# Patient Record
Sex: Female | Born: 1960
Health system: Southern US, Community
[De-identification: ages and names within clinical notes are randomized; demographics above are authoritative.]

## PROBLEM LIST (undated history)

## (undated) DIAGNOSIS — M1990 Unspecified osteoarthritis, unspecified site: Secondary | ICD-10-CM

## (undated) DIAGNOSIS — K219 Gastro-esophageal reflux disease without esophagitis: Secondary | ICD-10-CM

## (undated) DIAGNOSIS — K8020 Calculus of gallbladder without cholecystitis without obstruction: Secondary | ICD-10-CM

## (undated) DIAGNOSIS — K8050 Calculus of bile duct without cholangitis or cholecystitis without obstruction: Secondary | ICD-10-CM

## (undated) DIAGNOSIS — Z8742 Personal history of other diseases of the female genital tract: Secondary | ICD-10-CM

## (undated) DIAGNOSIS — E049 Nontoxic goiter, unspecified: Secondary | ICD-10-CM

## (undated) DIAGNOSIS — Z8759 Personal history of other complications of pregnancy, childbirth and the puerperium: Secondary | ICD-10-CM

## (undated) DIAGNOSIS — Z8489 Family history of other specified conditions: Secondary | ICD-10-CM

## (undated) DIAGNOSIS — Z973 Presence of spectacles and contact lenses: Secondary | ICD-10-CM

## (undated) DIAGNOSIS — K5900 Constipation, unspecified: Secondary | ICD-10-CM

## (undated) DIAGNOSIS — R600 Localized edema: Secondary | ICD-10-CM

## (undated) HISTORY — PX: ECTOPIC PREGNANCY SURGERY: SHX613

## (undated) HISTORY — PX: COLONOSCOPY: SHX174

## (undated) HISTORY — PX: RIGHT OOPHORECTOMY: SHX2359

---

## 1982-04-14 DIAGNOSIS — Z8759 Personal history of other complications of pregnancy, childbirth and the puerperium: Secondary | ICD-10-CM

## 1982-04-14 HISTORY — DX: Personal history of other complications of pregnancy, childbirth and the puerperium: Z87.59

## 2003-10-19 ENCOUNTER — Other Ambulatory Visit: Admission: RE | Admit: 2003-10-19 | Discharge: 2003-10-19 | Payer: Self-pay | Admitting: Obstetrics and Gynecology

## 2005-01-08 ENCOUNTER — Other Ambulatory Visit: Admission: RE | Admit: 2005-01-08 | Discharge: 2005-01-08 | Payer: Self-pay | Admitting: Obstetrics and Gynecology

## 2008-04-14 HISTORY — PX: KNEE ARTHROSCOPY: SUR90

## 2013-07-28 ENCOUNTER — Other Ambulatory Visit: Payer: Self-pay | Admitting: Physician Assistant

## 2013-07-28 MED ORDER — OSELTAMIVIR PHOSPHATE 75 MG PO CAPS
75.0000 mg | ORAL_CAPSULE | Freq: Every day | ORAL | Status: AC
Start: 2013-07-28 — End: 2013-08-02

## 2014-08-28 ENCOUNTER — Ambulatory Visit (INDEPENDENT_AMBULATORY_CARE_PROVIDER_SITE_OTHER): Payer: BLUE CROSS/BLUE SHIELD | Admitting: Internal Medicine

## 2014-08-28 ENCOUNTER — Encounter: Payer: Self-pay | Admitting: Internal Medicine

## 2014-08-28 VITALS — BP 138/86 | HR 74 | Temp 98.0°F | Resp 18 | Ht 65.0 in | Wt 243.0 lb

## 2014-08-28 DIAGNOSIS — Z131 Encounter for screening for diabetes mellitus: Secondary | ICD-10-CM

## 2014-08-28 DIAGNOSIS — Z1329 Encounter for screening for other suspected endocrine disorder: Secondary | ICD-10-CM

## 2014-08-28 DIAGNOSIS — E559 Vitamin D deficiency, unspecified: Secondary | ICD-10-CM

## 2014-08-28 DIAGNOSIS — Z79899 Other long term (current) drug therapy: Secondary | ICD-10-CM

## 2014-08-28 DIAGNOSIS — Z1389 Encounter for screening for other disorder: Secondary | ICD-10-CM

## 2014-08-28 DIAGNOSIS — IMO0001 Reserved for inherently not codable concepts without codable children: Secondary | ICD-10-CM

## 2014-08-28 DIAGNOSIS — R030 Elevated blood-pressure reading, without diagnosis of hypertension: Secondary | ICD-10-CM

## 2014-08-28 DIAGNOSIS — Z0000 Encounter for general adult medical examination without abnormal findings: Secondary | ICD-10-CM

## 2014-08-28 DIAGNOSIS — Z130 Encounter for screening for diseases of the blood and blood-forming organs and certain disorders involving the immune mechanism: Secondary | ICD-10-CM

## 2014-08-28 DIAGNOSIS — Z13220 Encounter for screening for lipoid disorders: Secondary | ICD-10-CM

## 2014-08-28 DIAGNOSIS — Z1212 Encounter for screening for malignant neoplasm of rectum: Secondary | ICD-10-CM

## 2014-08-28 LAB — HEPATIC FUNCTION PANEL
ALT: 15 U/L (ref 0–35)
AST: 14 U/L (ref 0–37)
Albumin: 4.1 g/dL (ref 3.5–5.2)
Alkaline Phosphatase: 78 U/L (ref 39–117)
BILIRUBIN INDIRECT: 0.2 mg/dL (ref 0.2–1.2)
Bilirubin, Direct: 0.1 mg/dL (ref 0.0–0.3)
Total Bilirubin: 0.3 mg/dL (ref 0.2–1.2)
Total Protein: 7.7 g/dL (ref 6.0–8.3)

## 2014-08-28 LAB — CBC WITH DIFFERENTIAL/PLATELET
BASOS PCT: 0 % (ref 0–1)
Basophils Absolute: 0 10*3/uL (ref 0.0–0.1)
EOS ABS: 0.2 10*3/uL (ref 0.0–0.7)
EOS PCT: 3 % (ref 0–5)
HEMATOCRIT: 36.8 % (ref 36.0–46.0)
HEMOGLOBIN: 12.1 g/dL (ref 12.0–15.0)
Lymphocytes Relative: 40 % (ref 12–46)
Lymphs Abs: 2.6 10*3/uL (ref 0.7–4.0)
MCH: 26.7 pg (ref 26.0–34.0)
MCHC: 32.9 g/dL (ref 30.0–36.0)
MCV: 81.2 fL (ref 78.0–100.0)
MONO ABS: 0.6 10*3/uL (ref 0.1–1.0)
MONOS PCT: 9 % (ref 3–12)
MPV: 10.8 fL (ref 8.6–12.4)
Neutro Abs: 3.2 10*3/uL (ref 1.7–7.7)
Neutrophils Relative %: 48 % (ref 43–77)
Platelets: 294 10*3/uL (ref 150–400)
RBC: 4.53 MIL/uL (ref 3.87–5.11)
RDW: 13.9 % (ref 11.5–15.5)
WBC: 6.6 10*3/uL (ref 4.0–10.5)

## 2014-08-28 LAB — BASIC METABOLIC PANEL WITH GFR
BUN: 10 mg/dL (ref 6–23)
CALCIUM: 9.6 mg/dL (ref 8.4–10.5)
CO2: 28 meq/L (ref 19–32)
CREATININE: 0.74 mg/dL (ref 0.50–1.10)
Chloride: 103 mEq/L (ref 96–112)
GFR, Est African American: >89 mL/min
GFR, Est Non African American: >89 mL/min
Glucose, Bld: 85 mg/dL (ref 70–99)
Potassium: 4.1 mEq/L (ref 3.5–5.3)
SODIUM: 140 meq/L (ref 135–145)

## 2014-08-28 LAB — LIPID PANEL
CHOLESTEROL: 180 mg/dL (ref 0–200)
HDL: 63 mg/dL (ref >=46)
LDL Cholesterol: 107 mg/dL — ABNORMAL HIGH (ref 0–99)
Total CHOL/HDL Ratio: 2.9 Ratio
Triglycerides: 52 mg/dL (ref <150)
VLDL: 10 mg/dL (ref 0–40)

## 2014-08-28 LAB — IRON AND TIBC
%SAT: 15 % — AB (ref 20–55)
IRON: 52 ug/dL (ref 42–145)
TIBC: 356 ug/dL (ref 250–470)
UIBC: 304 ug/dL (ref 125–400)

## 2014-08-28 LAB — MAGNESIUM: MAGNESIUM: 1.8 mg/dL (ref 1.5–2.5)

## 2014-08-28 LAB — VITAMIN B12: VITAMIN B 12: 804 pg/mL (ref 211–911)

## 2014-08-28 LAB — TSH: TSH: 1.117 u[IU]/mL (ref 0.350–4.500)

## 2014-08-28 MED ORDER — HYDROCHLOROTHIAZIDE 12.5 MG PO CAPS: 12.5000 mg | ORAL_CAPSULE | Freq: Every day | ORAL | Status: DC

## 2014-08-28 NOTE — Patient Instructions (Signed)

## 2014-08-28 NOTE — Progress Notes (Signed)
Patient ID: Vicki Shea, female   DOB: 19-Oct-1960, 54 y.o.   MRN: [id number]    Annual Screening Comprehensive Examination   This very nice 54 y.o.female presents for complete physical.  Patient has no major health issues.  Patient reports no complaints at this time.  Patient reports that she is not taking any medications and she is otherwise healthy.  She normally sees her ObGyn who recommends that she come here to be evaluated.  Patient reports that she was sitting a lot this weekend and then also got very excited during church but woke up this morning to some bruising.  She cannot think of a particular injury to the backs of her legs.    She does report that she has had some swelling in her legs.  She used to be on HCTZ.    She is not doing any type of exercise.  She is doing a lot of hours at work and she is going to school.     Finally, patient has history of Vitamin D Deficiency and last vitamin D was No results found for: VD25OH.  Currently on supplementation     No current outpatient prescriptions on file prior to visit.   No current facility-administered medications on file prior to visit.    Allergies  Allergen Reactions  . Cabbage     Red cabbage- Lip swelling  . Voltaren [Diclofenac Sodium]     Swelling    No past medical history on file.   There is no immunization history on file for this patient.  No past surgical history on file.  No family history on file.  History   Social History  . Marital Status: Unknown    Spouse Name: N/A  . Number of Children: N/A  . Years of Education: N/A   Occupational History  . Not on file.   Social History Main Topics  . Smoking status: Former Smoker    Quit date: 08/27/1984  . Smokeless tobacco: Not on file  . Alcohol Use: Not on file  . Drug Use: Not on file  . Sexual Activity: Not on file   Other Topics Concern  . Not on file   Social History Narrative  . No narrative on file   Review of Systems   Constitutional: Negative for fever, chills, weight loss and malaise/fatigue.  HENT: Negative for congestion, ear pain and sore throat.   Eyes: Negative.   Respiratory: Negative for cough, shortness of breath and wheezing.   Cardiovascular: Positive for leg swelling. Negative for chest pain and palpitations.  Gastrointestinal: Positive for heartburn and constipation. Negative for nausea, vomiting, diarrhea, blood in stool and melena.  Genitourinary: Negative.   Musculoskeletal: Positive for joint pain.  Skin: Negative.   Neurological: Positive for headaches. Negative for dizziness, sensory change and loss of consciousness.  Psychiatric/Behavioral: Negative for depression. The patient is not nervous/anxious and does not have insomnia.       Physical Exam  BP 138/86 mmHg  Pulse 74  Temp(Src) 98 F (36.7 C) (Temporal)  Resp 18  Ht 5\' 5"  (1.651 m)  Wt 243 lb (110.224 kg)  BMI 40.44 kg/m2  General Appearance: Well nourished and in no apparent distress. Eyes: PERRLA, EOMs, conjunctiva no swelling or erythema, normal fundi and vessels. Sinuses: No frontal/maxillary tenderness ENT/Mouth: EACs patent / TMs  nl. Nares clear without erythema, swelling, mucoid exudates. Oral hygiene is good. No erythema, swelling, or exudate. Tongue normal, non-obstructing. Tonsils not swollen or erythematous. Hearing normal.  Neck: Supple, thyroid normal. No bruits, nodes or JVD. Respiratory: Respiratory effort normal.  BS equal and clear bilateral without rales, rhonci, wheezing or stridor. Cardio: Heart sounds are normal with regular rate and rhythm and no murmurs, rubs or gallops. Peripheral pulses are normal and equal bilaterally without edema. No aortic or femoral bruits. Chest: symmetric with normal excursions and percussion. Breasts: Symmetric, without lumps, nipple discharge, retractions, or fibrocystic changes.  Abdomen: Obese with panus Flat, soft, with bowl sounds. Nontender, no guarding, rebound,  hernias, masses, or organomegaly.  Lymphatics: Non tender without lymphadenopathy.  Genitourinary:  Musculoskeletal: Full ROM all peripheral extremities, joint stability, 5/5 strength, and normal gait.  Minimal leg swelling Skin: Warm and dry without rashes, lesions, cyanosis, clubbing or  ecchymosis.  Neuro: Cranial nerves intact, reflexes equal bilaterally. Normal muscle tone, no cerebellar symptoms. Sensation intact.  Pysch: Awake and oriented X 3, normal affect, Insight and Judgment appropriate.   Assessment and Plan    1. Medication management  - CBC with Differential/Platelet - BASIC METABOLIC PANEL WITH GFR - Hepatic function panel - Magnesium  2. Screening for hyperlipidemia -diet and exercise - Lipid panel  3. Screening for diabetes mellitus  - Hemoglobin A1c - Insulin, random  4. Vitamin D deficiency -cont supplement - Vit D  25 hydroxy (rtn osteoporosis monitoring)  5. Screening for deficiency anemia  - Iron and TIBC - Vitamin B12  6. Screening for hematuria or proteinuria  - Urinalysis, Routine w reflex microscopic - Microalbumin / creatinine urine ratio  7. Elevated BP -start HCTZ 12.5 mg prn for leg swelling - EKG 12-Lead - [place], RETROPERITNL ABD,  LTD  8. Screening for rectal cancer  - POC Hemoccult Bld/Stl (3-Cd Home Screen); Future  9. Screening for hypothyroidism  - TSH     Continue prudent diet as discussed, weight control, regular exercise, and medications. Routine screening labs and tests as requested with regular follow-up as recommended.  Over 40 minutes of exam, counseling, chart review and critical decision making was performed

## 2014-08-29 LAB — URINALYSIS, ROUTINE W REFLEX MICROSCOPIC
Bilirubin Urine: NEGATIVE
Glucose, UA: NEGATIVE mg/dL
Hgb urine dipstick: NEGATIVE
Ketones, ur: NEGATIVE mg/dL
LEUKOCYTES UA: NEGATIVE
Nitrite: NEGATIVE
PH: 6.5 (ref 5.0–8.0)
Protein, ur: NEGATIVE mg/dL
Specific Gravity, Urine: <1.005 — ABNORMAL LOW (ref 1.005–1.030)
Urobilinogen, UA: 0.2 mg/dL (ref 0.0–1.0)

## 2014-08-29 LAB — HEMOGLOBIN A1C
Hgb A1c MFr Bld: 6.2 % — ABNORMAL HIGH (ref <5.7)
Mean Plasma Glucose: 131 mg/dL — ABNORMAL HIGH (ref <117)

## 2014-08-29 LAB — MICROALBUMIN / CREATININE URINE RATIO
CREATININE, URINE: 22.9 mg/dL
Microalb, Ur: <0.2 mg/dL (ref <2.0)

## 2014-08-29 LAB — VITAMIN D 25 HYDROXY (VIT D DEFICIENCY, FRACTURES): Vit D, 25-Hydroxy: 23 ng/mL — ABNORMAL LOW (ref 30–100)

## 2014-08-29 LAB — INSULIN, RANDOM: Insulin: 6.6 u[IU]/mL (ref 2.0–19.6)

## 2015-01-20 ENCOUNTER — Encounter: Payer: Self-pay | Admitting: *Deleted

## 2015-08-21 ENCOUNTER — Other Ambulatory Visit: Payer: Self-pay | Admitting: Internal Medicine

## 2015-08-21 DIAGNOSIS — J4521 Mild intermittent asthma with (acute) exacerbation: Secondary | ICD-10-CM

## 2015-08-21 MED ORDER — AZITHROMYCIN 250 MG PO TABS: ORAL_TABLET | ORAL | Status: DC

## 2015-08-21 MED ORDER — PREDNISONE 20 MG PO TABS: ORAL_TABLET | ORAL | Status: DC

## 2015-08-21 MED ORDER — BENZONATATE 200 MG PO CAPS: ORAL_CAPSULE | ORAL | Status: DC

## 2015-08-22 ENCOUNTER — Encounter: Payer: Self-pay | Admitting: Internal Medicine

## 2015-08-22 ENCOUNTER — Ambulatory Visit (INDEPENDENT_AMBULATORY_CARE_PROVIDER_SITE_OTHER): Payer: BLUE CROSS/BLUE SHIELD | Admitting: Internal Medicine

## 2015-08-22 VITALS — BP 120/88 | HR 91 | Temp 97.7°F | Resp 16 | Ht 65.0 in | Wt 237.2 lb

## 2015-08-22 DIAGNOSIS — J4521 Mild intermittent asthma with (acute) exacerbation: Secondary | ICD-10-CM | POA: Diagnosis not present

## 2015-08-22 MED ORDER — IPRATROPIUM-ALBUTEROL 0.5-2.5 (3) MG/3ML IN SOLN
3.0000 mL | Freq: Once | RESPIRATORY_TRACT | Status: AC
  Administered 2015-08-22: 3 mL via RESPIRATORY_TRACT

## 2015-08-22 MED ORDER — FLUTICASONE FUROATE-VILANTEROL 100-25 MCG/INH IN AEPB: 1.0000 | INHALATION_SPRAY | Freq: Every day | RESPIRATORY_TRACT | Status: DC

## 2015-08-22 MED ORDER — BENZONATATE 200 MG PO CAPS: ORAL_CAPSULE | ORAL | Status: DC

## 2015-08-22 MED ORDER — FLUCONAZOLE 150 MG PO TABS: 150.0000 mg | ORAL_TABLET | Freq: Once | ORAL | Status: DC

## 2015-08-22 MED ORDER — DEXAMETHASONE 0.75 MG PO TABS: ORAL_TABLET | ORAL | Status: DC

## 2015-08-22 MED ORDER — ALBUTEROL SULFATE HFA 108 (90 BASE) MCG/ACT IN AERS: 2.0000 | INHALATION_SPRAY | Freq: Four times a day (QID) | RESPIRATORY_TRACT | Status: DC | PRN

## 2015-08-22 MED ORDER — AZITHROMYCIN 250 MG PO TABS: ORAL_TABLET | ORAL | Status: DC

## 2015-08-22 NOTE — Progress Notes (Signed)
HPI  Patient presents to the office for evaluation of cough and sore throat.  It has been going on for 6 days.  Patient reports night > day, dry, barky, worse with lying down, cough with wheezing.  They also endorse change in voice, chills, fever, postnasal drip, shortness of breath, wheezing and nasal congestion, sinus pressure, headache, sore throat, dizzyness..  They have tried antitussives.  They report that nothing has worked.  They denies other sick contacts.  Review of Systems  Constitutional: Positive for chills and malaise/fatigue. Negative for fever.  HENT: Positive for congestion and sore throat. Negative for ear pain.   Eyes: Negative.   Respiratory: Positive for cough, shortness of breath and wheezing. Negative for sputum production.   Cardiovascular: Negative for chest pain, palpitations and leg swelling.  Skin: Negative.   Neurological: Positive for headaches.  Psychiatric/Behavioral: Negative for hallucinations.    PE:  General:  Alert and non-toxic, WDWN, NAD HEENT: NCAT, PERLA, EOM normal, no occular discharge or erythema.  Nasal mucosal edema with sinus tenderness to palpation.  Oropharynx clear with minimal oropharyngeal edema and erythema.  Mucous membranes moist and pink. Neck:  Cervical adenopathy Chest:  RRR no MRGs.  Lungs clear to auscultation A&P with no rhonchi or rales.  Wheeze present throughout all lung fields Abdomen: +BS x 4 quadrants, soft, non-tender, no guarding, rigidity, or rebound. Skin: warm and dry no rash Neuro: A&Ox4, CN II-XII grossly intact  Assessment and Plan:   1. Asthmatic bronchitis, mild intermittent, with acute exacerbation -duoneb given in office with some improvement -breo sample -albuterol -phenergan codeine syrup -decadron taper -antihistamine -flonase - azithromycin (ZITHROMAX) 250 MG tablet; Take 2 tablets (500 mg) on  Day 1,  followed by 1 tablet (250 mg) once daily on Days 2 through 5.  Dispense: 6 each; Refill: 0 -  benzonatate (TESSALON) 200 MG capsule; Take 1 capsule 3 x / day to prevent cough  Dispense: 30 capsule; Refill: 0

## 2015-08-22 NOTE — Patient Instructions (Signed)
Please take zpak until it is completely gone.    Please take the decadron as prescribed.  Take in the mornings with food.    Please take ranitidine 150 mg twice daily with food.  This will help with your stomach pains and will also help to dry up any congestion.  Please use saline in your nose as often as possible to moisturize and to help rinse out sinuses.  Please use albuterol inhaler up to every six hours as needed for coughing and shortness of breath.  Please use breo inhaler once daily until this resolves.  Please rinse mouth out after using.  Please take cough syrup as needed for coughing.  This may make you fatigued so please do not drive on this medication.  Please use tessalon as a non-sedating cough medication for during the day time.  Please call the office if breathing worsens.

## 2016-01-08 ENCOUNTER — Encounter: Payer: Self-pay | Admitting: Internal Medicine

## 2016-01-08 ENCOUNTER — Ambulatory Visit (INDEPENDENT_AMBULATORY_CARE_PROVIDER_SITE_OTHER): Payer: BLUE CROSS/BLUE SHIELD | Admitting: Internal Medicine

## 2016-01-08 VITALS — BP 122/70 | HR 88 | Temp 98.4°F | Resp 18 | Ht 65.0 in | Wt 250.0 lb

## 2016-01-08 DIAGNOSIS — K219 Gastro-esophageal reflux disease without esophagitis: Secondary | ICD-10-CM | POA: Diagnosis not present

## 2016-01-08 MED ORDER — FLUCONAZOLE 150 MG PO TABS: 150.0000 mg | ORAL_TABLET | Freq: Once | ORAL | 0 refills | Status: AC

## 2016-01-08 NOTE — Progress Notes (Signed)
   Subjective:    Patient ID: Vicki Shea, female    DOB: 28-Nov-1960, 55 y.o.   MRN: [id number]  HPI  Patient presents to the office for evaluation of epigastric stomach pain which has been going for the last couple months.  She reports that she has been take the zantac only on an as needed basis.  She reports that she is now having to take tums.  She reports that she did have a nexium this morning.    She also thinks that she has a yeast infection.  She reports that she did have a slight discharge.  There is no odor to it.  She reports that she still feels like she has some irritation when she wipes.  She reports that she is having some vaginal dryness.     Review of Systems  Constitutional: Negative for chills and fever.  HENT: Negative for congestion, ear pain and sore throat.   Eyes: Negative.   Respiratory: Negative for cough, shortness of breath and wheezing.   Cardiovascular: Negative for chest pain, palpitations and leg swelling.  Gastrointestinal: Positive for abdominal pain. Negative for blood in stool, constipation, diarrhea, nausea and vomiting.  Genitourinary: Negative.   Skin: Negative.   Neurological: Negative for dizziness and headaches.  Psychiatric/Behavioral: The patient is not nervous/anxious.        Objective:   Physical Exam  Constitutional: She is oriented to person, place, and time. She appears well-developed and well-nourished. No distress.  HENT:  Head: Normocephalic.  Mouth/Throat: Oropharynx is clear and moist. No oropharyngeal exudate.  Eyes: Conjunctivae are normal. No scleral icterus.  Neck: Normal range of motion. Neck supple. No JVD present. No thyromegaly present.  Cardiovascular: Normal rate, regular rhythm, normal heart sounds and intact distal pulses.  Exam reveals no gallop and no friction rub.   No murmur heard. Pulmonary/Chest: Effort normal and breath sounds normal. No respiratory distress. She has no wheezes. She has no rales. She  exhibits no tenderness.  Abdominal: Soft. Normal appearance and bowel sounds are normal. She exhibits no distension and no mass. There is tenderness in the epigastric area. There is no rebound, no guarding and no CVA tenderness.  Musculoskeletal: Normal range of motion.  Lymphadenopathy:    She has no cervical adenopathy.  Neurological: She is alert and oriented to person, place, and time.  Skin: Skin is warm and dry. She is not diaphoretic.  Psychiatric: She has a normal mood and affect. Her behavior is normal. Judgment and thought content normal.  Nursing note and vitals reviewed.   Vitals:   01/08/16 1452  BP: 122/70  Pulse: 88  Resp: 18  Temp: 98.4 F (36.9 C)         Assessment & Plan:    1. Gastroesophageal reflux disease, esophagitis presence not specified -zantac 300 mg BID -omeprazole 20 mg x 2 weeks -GERD food irritants given -recheck prn

## 2016-01-08 NOTE — Patient Instructions (Addendum)
Please start taking 300 mg of zantac in the morning and the evening.    Please take nexium 1 tablet in the morning on an empty stomach.  Wait 30 minutes prior to eating and taking the rest of your medications.  Please take the diflucan tablet.  If you have no relief in a week let me know.     ood Choices for Gastroesophageal Reflux Disease, Adult When you have gastroesophageal reflux disease (GERD), the foods you eat and your eating habits are very important. Choosing the right foods can help ease the discomfort of GERD. WHAT GENERAL GUIDELINES DO I NEED TO FOLLOW?  Choose fruits, vegetables, whole grains, low-fat dairy products, and low-fat meat, fish, and poultry.  Limit fats such as oils, salad dressings, butter, nuts, and avocado.  Keep a food diary to identify foods that cause symptoms.  Avoid foods that cause reflux. These may be different for different people.  Eat frequent small meals instead of three large meals each day.  Eat your meals slowly, in a relaxed setting.  Limit fried foods.  Cook foods using methods other than frying.  Avoid drinking alcohol.  Avoid drinking large amounts of liquids with your meals.  Avoid bending over or lying down until 2-3 hours after eating. WHAT FOODS ARE NOT RECOMMENDED? The following are some foods and drinks that may worsen your symptoms: Vegetables Tomatoes. Tomato juice. Tomato and spaghetti sauce. Chili peppers. Onion and garlic. Horseradish. Fruits Oranges, grapefruit, and lemon (fruit and juice). Meats High-fat meats, fish, and poultry. This includes hot dogs, ribs, ham, sausage, salami, and bacon. Dairy Whole milk and chocolate milk. Sour cream. Cream. Butter. Ice cream. Cream cheese.  Beverages Coffee and tea, with or without caffeine. Carbonated beverages or energy drinks. Condiments Hot sauce. Barbecue sauce.  Sweets/Desserts Chocolate and cocoa. Donuts. Peppermint and spearmint. Fats and Oils High-fat foods,  including [place] fries and potato chips. Other Vinegar. Strong spices, such as black pepper, white pepper, red pepper, cayenne, curry powder, cloves, ginger, and chili powder. The items listed above may not be a complete list of foods and beverages to avoid. Contact your dietitian for more information.   This information is not intended to replace advice given to you by your health care provider. Make sure you discuss any questions you have with your health care provider.   Document Released: 03/31/2005 Document Revised: 04/21/2014 Document Reviewed: 02/02/2013 Elsevier Interactive Patient Education [employer].

## 2016-01-24 ENCOUNTER — Other Ambulatory Visit: Payer: Self-pay | Admitting: *Deleted

## 2016-01-24 MED ORDER — RANITIDINE HCL 150 MG PO TABS: 150.0000 mg | ORAL_TABLET | Freq: Every day | ORAL | 3 refills | Status: DC | PRN

## 2016-03-13 ENCOUNTER — Other Ambulatory Visit: Payer: Self-pay | Admitting: Internal Medicine

## 2016-06-25 ENCOUNTER — Ambulatory Visit (INDEPENDENT_AMBULATORY_CARE_PROVIDER_SITE_OTHER): Payer: BLUE CROSS/BLUE SHIELD | Admitting: Internal Medicine

## 2016-06-25 ENCOUNTER — Encounter: Payer: Self-pay | Admitting: Internal Medicine

## 2016-06-25 VITALS — BP 124/70 | HR 86 | Temp 98.0°F | Resp 16 | Ht 65.0 in | Wt 244.0 lb

## 2016-06-25 DIAGNOSIS — K219 Gastro-esophageal reflux disease without esophagitis: Secondary | ICD-10-CM

## 2016-06-25 MED ORDER — SUCRALFATE 1 G PO TABS: 1.0000 g | ORAL_TABLET | Freq: Four times a day (QID) | ORAL | 1 refills | Status: DC

## 2016-06-25 MED ORDER — RANITIDINE HCL 300 MG PO TABS: 300.0000 mg | ORAL_TABLET | Freq: Every day | ORAL | 1 refills | Status: DC

## 2016-06-25 MED ORDER — OMEPRAZOLE 40 MG PO CPDR: 40.0000 mg | DELAYED_RELEASE_CAPSULE | Freq: Every day | ORAL | 0 refills | Status: DC

## 2016-06-25 NOTE — Progress Notes (Signed)
Assessment and Plan:   1. Gastroesophageal reflux disease without esophagitis -start omeprazole 40 mg on empty stomach in the morning -cont 300 mg zantac BID -carafate 1 mg QID AC as needed for pain and discomfort -recommended weight loss and also staying upright after eating -lists of trigger foods were given to the patient and also encouraged here to keep a food diary to see which foods bother her -if no relief will need to see GI for endoscopy -patient aware to call office for worsening symptoms.  She is to go to the ER for coffee ground emesis, hematochezia or severe melena.  She agrees with the above plan.     HPI 56 y.o.female presents for worsening of acid reflux.  She reports that she has been having some burning in her chest and has a gripping sensation. She has been taking her zantac at home without relief.  She reports that she is miserable and is having trouble sleeping.  She reports that she has been having no trouble with swallowing.  No hematochezia or melena.  She is not having vomiting or coffee ground emesis.     No past medical history on file.   Allergies  Allergen Reactions  . Cabbage     Red cabbage- Lip swelling  . Voltaren [Diclofenac Sodium]     Swelling      Current Outpatient Prescriptions on File Prior to Visit  Medication Sig Dispense Refill  . Cholecalciferol (VITAMIN D PO) Take 5,000 Units by mouth daily.    . hydrochlorothiazide (MICROZIDE) 12.5 MG capsule TAKE 1 CAPSULE BY MOUTH DAILY 30 capsule 0  . ranitidine (ZANTAC) 150 MG tablet Take 1 tablet (150 mg total) by mouth daily as needed for heartburn. 90 tablet 3  . vitamin C (ASCORBIC ACID) 500 MG tablet Take 500 mg by mouth daily.     No current facility-administered medications on file prior to visit.     ROS: all negative except above.   Physical Exam: Filed Weights   06/25/16 0845  Weight: 244 lb (110.7 kg)   BP 124/70   Pulse 86   Temp 98 F (36.7 C) (Temporal)   Resp 16   Ht 5'  5" (1.651 m)   Wt 244 lb (110.7 kg)   BMI 40.60 kg/m  General Appearance: Well developed well nourished, non-toxic appearing in no apparent distress. Eyes: PERRLA, EOMs, conjunctiva w/ no swelling or erythema or discharge Sinuses: No Frontal/maxillary tenderness ENT/Mouth: Ear canals clear without swelling or erythema.  TM's normal bilaterally with no retractions, bulging, or loss of landmarks.   Neck: Supple, thyroid normal, no notable JVD  Respiratory: Respiratory effort normal, Clear breath sounds anteriorly and posteriorly bilaterally without rales, rhonchi, wheezing or stridor. No retractions or accessory muscle usage. Cardio: RRR with no MRGs.   Abdomen: Soft, + BS.  Non tender, no guarding, rebound, hernias, masses.  Musculoskeletal: Full ROM, 5/5 strength, normal gait.  Skin: Warm, dry without rashes  Neuro: Awake and oriented X 3, Cranial nerves intact. Normal muscle tone, no cerebellar symptoms. Sensation intact.  Psych: normal affect, Insight and Judgment appropriate.     Starlyn Skeans, PA-C 9:14 AM [facility]

## 2016-06-25 NOTE — Patient Instructions (Signed)
Food Choices for Gastroesophageal Reflux Disease, Adult When you have gastroesophageal reflux disease (GERD), the foods you eat and your eating habits are very important. Choosing the right foods can help ease your discomfort. What guidelines do I need to follow?  Choose fruits, vegetables, whole grains, and low-fat dairy products.  Choose low-fat meat, fish, and poultry.  Limit fats such as oils, salad dressings, butter, nuts, and avocado.  Keep a food diary. This helps you identify foods that cause symptoms.  Avoid foods that cause symptoms. These may be different for everyone.  Eat small meals often instead of 3 large meals a day.  Eat your meals slowly, in a place where you are relaxed.  Limit fried foods.  Cook foods using methods other than frying.  Avoid drinking alcohol.  Avoid drinking large amounts of liquids with your meals.  Avoid bending over or lying down until 2-3 hours after eating. What foods are not recommended? These are some foods and drinks that may make your symptoms worse: Vegetables  Tomatoes. Tomato juice. Tomato and spaghetti sauce. Chili peppers. Onion and garlic. Horseradish. Fruits  Oranges, grapefruit, and lemon (fruit and juice). Meats  High-fat meats, fish, and poultry. This includes hot dogs, ribs, ham, sausage, salami, and bacon. Dairy  Whole milk and chocolate milk. Sour cream. Cream. Butter. Ice cream. Cream cheese. Drinks  Coffee and tea. Bubbly (carbonated) drinks or energy drinks. Condiments  Hot sauce. Barbecue sauce. Sweets/Desserts  Chocolate and cocoa. Donuts. Peppermint and spearmint. Fats and Oils  High-fat foods. This includes French fries and potato chips. Other  Vinegar. Strong spices. This includes black pepper, white pepper, red pepper, cayenne, curry powder, cloves, ginger, and chili powder. The items listed above may not be a complete list of foods and drinks to avoid. Contact your dietitian for more information.    This information is not intended to replace advice given to you by your health care provider. Make sure you discuss any questions you have with your health care provider. Document Released: 09/30/2011 Document Revised: 09/06/2015 Document Reviewed: 02/02/2013 Elsevier Interactive Patient Education  2017 [employer]  

## 2016-08-25 ENCOUNTER — Other Ambulatory Visit: Payer: Self-pay | Admitting: *Deleted

## 2016-08-25 MED ORDER — OMEPRAZOLE 40 MG PO CPDR: 40.0000 mg | DELAYED_RELEASE_CAPSULE | Freq: Every day | ORAL | 2 refills | Status: DC

## 2016-08-25 MED ORDER — SUCRALFATE 1 G PO TABS: 1.0000 g | ORAL_TABLET | Freq: Four times a day (QID) | ORAL | 2 refills | Status: DC

## 2016-08-25 MED ORDER — RANITIDINE HCL 300 MG PO TABS: 300.0000 mg | ORAL_TABLET | Freq: Every day | ORAL | 2 refills | Status: DC

## 2016-08-26 ENCOUNTER — Other Ambulatory Visit: Payer: Self-pay

## 2016-08-26 MED ORDER — RANITIDINE HCL 300 MG PO TABS: 300.0000 mg | ORAL_TABLET | Freq: Every day | ORAL | 0 refills | Status: DC

## 2016-08-26 MED ORDER — SUCRALFATE 1 G PO TABS: 1.0000 g | ORAL_TABLET | Freq: Four times a day (QID) | ORAL | 0 refills | Status: DC

## 2016-08-26 MED ORDER — OMEPRAZOLE 40 MG PO CPDR: 40.0000 mg | DELAYED_RELEASE_CAPSULE | Freq: Every day | ORAL | 0 refills | Status: DC

## 2016-09-09 ENCOUNTER — Encounter: Payer: Self-pay | Admitting: Internal Medicine

## 2016-09-12 ENCOUNTER — Encounter: Payer: Self-pay | Admitting: Internal Medicine

## 2016-11-20 ENCOUNTER — Other Ambulatory Visit: Payer: Self-pay | Admitting: Physician Assistant

## 2016-11-26 NOTE — Progress Notes (Signed)
Complete Physical  Assessment and Plan:  Gastroesophageal reflux disease, esophagitis presence not specified Continue meds, but no responsive, do low fat diet, get [place] AB, if negative will refer to GI for possible HIDA/EGD  Routine general medical examination at a health care facility Need MGM and PAP, possible prolapse so will go to GYN  Screening cholesterol level -     Lipid panel  Screening for cardiovascular condition -     EKG 12-Lead  Screening for hematuria or proteinuria -     Urinalysis, Routine w reflex microscopic -     Microalbumin / creatinine urine ratio  Screening, anemia, deficiency, iron -     Iron,Total/Total Iron Binding Cap -     Vitamin B12  Abnormal glucose Discussed general issues about diabetes pathophysiology and management., Educational material distributed., Suggested low cholesterol diet., Encouraged aerobic exercise., Discussed foot care., Reminded to get yearly retinal exam. -     Hemoglobin A1c  Edema, unspecified type - weight loss discussed, continue compression stockings and elevation -     furosemide (LASIX) 20 MG tablet; Take 1 tablet (20 mg total) by mouth daily as needed for edema. -     TSH  Medication management -     CBC with Differential/Platelet -     BASIC METABOLIC PANEL WITH GFR -     Hepatic function panel -     Magnesium  Vitamin D deficiency -     VITAMIN D 25 Hydroxy (Vit-D Deficiency, Fractures)  Screening for viral disease -     HIV antibody -     Hepatitis C antibody  Epigastric pain + murphy's check AB [place], do low fat diet, if + stone will refer gen surgeon, if negative will refer GI for possible HIDA/EGD -     Amylase -     US Abdomen Complete; Future  Need for Tdap vaccination -     Tdap vaccine greater than or equal to 7yo IM    Discussed med's effects and SE's. Screening labs and tests as requested with regular follow-up as recommended. Over 40 minutes of exam, counseling, chart review, and complex,  high level critical decision making was performed this visit.  Future Appointments Date Time Provider [place]  11/28/2016 1:00 PM [facility] [place] 4 [facility]  06/01/2017 8:45 AM [name], PA-C [facility] None  12/04/2017 9:30 AM [name], PA-C [facility] None    HPI  56 y.o. MAAF female  presents for a complete physical and follow up for  does not have a problem list on file.Marland Kitchen  Her blood pressure has not been checked at home, today their BP is BP: 140/90 (LEFT ARM), states did not sleep well with stomach hurting.  She does not workout. She denies chest pain, shortness of breath, dizziness.  States has had fluid retention, has had normal echo, was bilateral hand and leg tightness/swelling. No family history of RA/autoimmune.  She continue to have AB pain, on prilosec 40mg  in AM, and zantac, not on carafate and has been avoiding acidic foods, still having pain, had pain all night yesterday.  She is not  on cholesterol medication and denies myalgias. Her cholesterol is not at goal. The cholesterol last visit was:   Lab Results  Component Value Date   CHOL 180 08/28/2014   HDL 63 08/28/2014   LDLCALC 107 (H) 08/28/2014   TRIG 52 08/28/2014   CHOLHDL 2.9 08/28/2014   She has been working on diet and exercise for prediabetes, and denies  paresthesia of the feet, polydipsia, polyuria and visual disturbances. Last A1C in the office was:  Lab Results  Component Value Date   HGBA1C 6.2 (H) 08/28/2014   Lab Results  Component Value Date   GFRAA >89 08/28/2014   Patient is on Vitamin D supplement.   Lab Results  Component Value Date   VD25OH 23 (L) 08/28/2014     BMI is Body mass index is 40.99 kg/m., she is working on diet and exercise. Wt Readings from Last 3 Encounters:  11/28/16 238 lb 12.8 oz (108.3 kg)  06/25/16 244 lb (110.7 kg)  01/08/16 250 lb (113.4 kg)   Has had painful intercourse with initial penetration, states some dryness but will feel  pressure/something falling out with intercourse and will feel something with urination and will have feel decreased stream.    Current Medications:  Current Outpatient Prescriptions on File Prior to Visit  Medication Sig Dispense Refill  . Cholecalciferol (VITAMIN D PO) Take 5,000 Units by mouth daily.    . hydrochlorothiazide (MICROZIDE) 12.5 MG capsule TAKE 1 CAPSULE BY MOUTH DAILY 30 capsule 0  . omeprazole (PRILOSEC) 40 MG capsule TAKE 1 CAPSULE(40 MG) BY MOUTH DAILY 90 capsule 0  . ranitidine (ZANTAC) 300 MG tablet TAKE 1 TABLET BY MOUTH AT BEDTIME 90 tablet 0  . sucralfate (CARAFATE) 1 g tablet Take 1 tablet (1 g total) by mouth 4 (four) times daily. 360 tablet 0  . vitamin C (ASCORBIC ACID) 500 MG tablet Take 500 mg by mouth daily.     No current facility-administered medications on file prior to visit.    Allergies:  Allergies  Allergen Reactions  . Cabbage     Red cabbage- Lip swelling  . Voltaren [Diclofenac Sodium]     Swelling   Medical History:  She  does not have a problem list on file.   Health Maintenance:   Tetanus: DUE Pneumovax: Prevnar 13:  Flu vaccine: 2017 Zostavax:  No LMP recorded. Patient is postmenopausal.  Pap: Saw Dr. Ouida Sills in the past, last pap 3 years ago MGM: 3 years ago DUE DEXA: N/A Colonoscopy: 2016 EGD: N/A  Patient Care Team: Unk Pinto, MD as PCP - General (Internal Medicine)  Surgical History:  She has a past surgical history that includes Knee arthroscopy (Right, 2010) and Tubal ligation (1984). Family History:  Herfamily history includes Atrial fibrillation in her mother; Hypertension in her brother, brother, and mother; Kidney disease in her mother; Stroke in her brother. Social History:  She reports that she quit smoking about 32 years ago. She has never used smokeless tobacco. She reports that she drinks alcohol. She reports that she does not use drugs. 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  HENT: Negative for congestion, ear pain and sore throat.   Eyes: Negative.   Respiratory: Negative for cough, shortness of breath and wheezing.   Cardiovascular: Positive for leg swelling. Negative for chest pain and palpitations.  Gastrointestinal: Positive for heartburn. Negative for blood in stool, constipation, diarrhea, melena, nausea and vomiting.  Genitourinary: Negative.   Musculoskeletal: Negative for joint pain.  Skin: Negative.   Neurological: Negative for dizziness, sensory change, loss of consciousness and headaches.  Psychiatric/Behavioral: Negative for depression. The patient is not nervous/anxious and does not have insomnia.     Physical Exam: Estimated body mass index is 40.99 kg/m as calculated from the following:  Height as of this encounter: 5\' 4"  (1.626 m).   Weight as of this encounter: 238 lb 12.8 oz (108.3 kg). BP 140/90 Comment: LEFT ARM  Pulse 72   Temp 97.9 F (36.6 C)   Resp 16   Ht 5\' 4"  (1.626 m)   Wt 238 lb 12.8 oz (108.3 kg)   SpO2 97%   BMI 40.99 kg/m  General Appearance: Well nourished, in no apparent distress.  Eyes: PERRLA, EOMs, conjunctiva no swelling or erythema, normal fundi and vessels.  Sinuses: No Frontal/maxillary tenderness  ENT/Mouth: Ext aud canals clear, normal light reflex with TMs without erythema, bulging. Good dentition. No erythema, swelling, or exudate on post pharynx. Tonsils not swollen or erythematous. Hearing normal.  Neck: Supple, thyroid normal. No bruits  Respiratory: Respiratory effort normal, BS equal bilaterally without rales, rhonchi, wheezing or stridor.  Cardio: RRR without murmurs, rubs or gallops. Brisk peripheral pulses without edema.  Chest: symmetric, with normal excursions and percussion.  Breasts: defer GYN.  Abdomen: Soft, + epigastric and RUQ with + murphy's, no guarding, rebound, hernias,  masses, or organomegaly.  Lymphatics: Non tender without lymphadenopathy.  Genitourinary: defer GYN Musculoskeletal: Full ROM all peripheral extremities,5/5 strength, and normal gait.  Skin: Warm, dry without rashes, lesions, ecchymosis. Neuro: Cranial nerves intact, reflexes equal bilaterally. Normal muscle tone, no cerebellar symptoms. Sensation intact.  Psych: Awake and oriented X 3, normal affect, Insight and Judgment appropriate.   EKG: WNL no ST changes. AORTA SCAN: defer  [name] 9:57 AM [facility]

## 2016-11-28 ENCOUNTER — Encounter: Payer: Self-pay | Admitting: Physician Assistant

## 2016-11-28 ENCOUNTER — Ambulatory Visit
Admission: RE | Admit: 2016-11-28 | Discharge: 2016-11-28 | Disposition: A | Discharge status: Home / Self Care | Payer: Self-pay | Source: Ambulatory Visit | Attending: Physician Assistant | Admitting: Physician Assistant

## 2016-11-28 ENCOUNTER — Ambulatory Visit (INDEPENDENT_AMBULATORY_CARE_PROVIDER_SITE_OTHER): Payer: BLUE CROSS/BLUE SHIELD | Admitting: Physician Assistant

## 2016-11-28 VITALS — BP 140/90 | HR 72 | Temp 97.9°F | Resp 16 | Ht 64.0 in | Wt 238.8 lb

## 2016-11-28 DIAGNOSIS — R609 Edema, unspecified: Secondary | ICD-10-CM

## 2016-11-28 DIAGNOSIS — Z1159 Encounter for screening for other viral diseases: Secondary | ICD-10-CM | POA: Diagnosis not present

## 2016-11-28 DIAGNOSIS — E559 Vitamin D deficiency, unspecified: Secondary | ICD-10-CM

## 2016-11-28 DIAGNOSIS — Z130 Encounter for screening for diseases of the blood and blood-forming organs and certain disorders involving the immune mechanism: Secondary | ICD-10-CM

## 2016-11-28 DIAGNOSIS — R7309 Other abnormal glucose: Secondary | ICD-10-CM | POA: Diagnosis not present

## 2016-11-28 DIAGNOSIS — K219 Gastro-esophageal reflux disease without esophagitis: Secondary | ICD-10-CM

## 2016-11-28 DIAGNOSIS — R1013 Epigastric pain: Secondary | ICD-10-CM

## 2016-11-28 DIAGNOSIS — Z13220 Encounter for screening for lipoid disorders: Secondary | ICD-10-CM | POA: Diagnosis not present

## 2016-11-28 DIAGNOSIS — Z136 Encounter for screening for cardiovascular disorders: Secondary | ICD-10-CM | POA: Diagnosis not present

## 2016-11-28 DIAGNOSIS — Z79899 Other long term (current) drug therapy: Secondary | ICD-10-CM

## 2016-11-28 DIAGNOSIS — Z1389 Encounter for screening for other disorder: Secondary | ICD-10-CM | POA: Diagnosis not present

## 2016-11-28 DIAGNOSIS — Z0000 Encounter for general adult medical examination without abnormal findings: Secondary | ICD-10-CM | POA: Diagnosis not present

## 2016-11-28 DIAGNOSIS — Z23 Encounter for immunization: Secondary | ICD-10-CM

## 2016-11-28 MED ORDER — FUROSEMIDE 20 MG PO TABS: 20.0000 mg | ORAL_TABLET | Freq: Every day | ORAL | 3 refills | Status: DC | PRN

## 2016-11-28 NOTE — Patient Instructions (Addendum)
[facility]  7 a.m.-6:30 p.m., Monday 7 a.m.-5 p.m., Tuesday-Friday Schedule an appointment by calling [phone].  Encourage you to get the 3D Mammogram  The 3D Mammogram is much more specific and sensitive to pick up breast cancer. For women with fibrocystic breast or lumpy breast it can be hard to determine if it is cancer or not but the 3D mammogram is able to tell this difference which cuts back on unneeded additional tests or scary call backs.   - over 40% increase in detection of breast cancer - over 40% reduction in false positives.  - fewer call backs - reduced anxiety - improved outcomes - [facility]   CALL YOUR GYN IN APPOINTMENT   Pelvic Organ Prolapse Pelvic organ prolapse is the stretching, bulging, or dropping of pelvic organs into an abnormal position. It happens when the muscles and tissues that surround and support pelvic structures are stretched or weak. Pelvic organ prolapse can involve:  Vagina (vaginal prolapse).  Uterus (uterine prolapse).  Bladder (cystocele).  Rectum (rectocele).  Intestines (enterocele).  When organs other than the vagina are involved, they often bulge into the vagina or protrude from the vagina, depending on how severe the prolapse is. What are the causes? Causes of this condition include:  Pregnancy, labor, and childbirth.  Long-lasting (chronic) cough.  Chronic constipation.  Obesity.  Past pelvic surgery.  Aging. During and after menopause, a decreased production of the hormone estrogen can weaken pelvic ligaments and muscles.  Consistently lifting more than 50 lb (23 kg).  Buildup of fluid in the abdomen due to certain diseases and other conditions.  What are the signs or symptoms? Symptoms of this condition include:  Loss of bladder control when you cough, sneeze, strain, and exercise (stress incontinence). This may be worse immediately following childbirth, and it may gradually  improve over time.  Feeling pressure in your pelvis or vagina. This pressure may increase when you cough or when you are having a bowel movement.  A bulge that protrudes from the opening of your vagina or against your vaginal wall. If your uterus protrudes through the opening of your vagina and rubs against your clothing, you may also experience soreness, ulcers, infection, pain, and bleeding.  Increased effort to have a bowel movement or urinate.  Pain in your low back.  Pain, discomfort, or disinterest in sexual intercourse.  Repeated bladder infections (urinary tract infections).  Difficulty inserting or inability to insert a tampon or applicator.  In some people, this condition does not cause any symptoms. How is this diagnosed? Your health care provider may perform an internal and external vaginal and rectal exam. During the exam, you may be asked to cough and strain while you are lying down, sitting, and standing up. Your health care provider will determine if other tests are required, such as bladder function tests. How is this treated? In most cases, this condition needs to be treated only if it produces symptoms. No treatment is guaranteed to correct the prolapse or relieve the symptoms completely. Treatment may include:  Lifestyle changes, such as: ? Avoiding drinking beverages that contain caffeine. ? Increasing your intake of high-fiber foods. This can help to decrease constipation and straining during bowel movements. ? Emptying your bladder at scheduled times (bladder training therapy). This can help to reduce or avoid urinary incontinence. ? Losing weight if you are overweight or obese.  Estrogen. Estrogen may help mild prolapse by increasing the strength and tone of  pelvic floor muscles.  Kegel exercises. These may help mild cases of prolapse by strengthening and tightening the muscles of the pelvic floor.  Pessary insertion. A pessary is a soft, flexible device that  is placed into your vagina by your health care provider to help support the vaginal walls and keep pelvic organs in place.  Surgery. This is often the only form of treatment for severe prolapse. Different types of surgeries are available.  Follow these instructions at home:  Wear a sanitary pad or absorbent product if you have urinary incontinence.  Avoid heavy lifting and straining with exercise and work. Do not hold your breath when you perform mild to moderate lifting and exercise activities. Limit your activities as directed by your health care provider.  Take medicines only as directed by your health care provider.  Perform Kegel exercises as directed by your health care provider.  If you have a pessary, take care of it as directed by your health care provider. Contact a health care provider if:  Your symptoms interfere with your daily activities or sex life.  You need medicine to help with the discomfort.  You notice bleeding from the vagina that is not related to your period.  You have a fever.  You have pain or bleeding when you urinate.  You have bleeding when you have a bowel movement.  You lose urine when you have sex.  You have chronic constipation.  You have a pessary that falls out.  You have vaginal discharge that has a bad smell.  You have low abdominal pain or cramping that is unusual for you. This information is not intended to replace advice given to you by your health care provider. Make sure you discuss any questions you have with your health care provider. Document Released: 10/26/2013 Document Revised: 09/06/2015 Document Reviewed: 06/13/2013 Elsevier Interactive Patient Education  2018 [place] wrist BP cuff from [facility] checking BP  Monitor your blood pressure at home. Go to the ER if any CP, SOB, nausea, dizziness, severe HA, changes vision/speech  Goal BP:  For patients younger than 60: Goal BP < 140/90. For patients 60  and older: Goal BP < 150/90. For patients with diabetes: Goal BP < 140/90. Your most recent BP: BP: 140/90 (LEFT ARM)   Take your medications faithfully as instructed. Maintain a healthy weight. Get at least 150 minutes of aerobic exercise per week. Minimize salt intake. Minimize alcohol intake  DASH Eating Plan DASH stands for "Dietary Approaches to Stop Hypertension." The DASH eating plan is a healthy eating plan that has been shown to reduce high blood pressure (hypertension). Additional health benefits may include reducing the risk of type 2 diabetes mellitus, heart disease, and stroke. The DASH eating plan may also help with weight loss. WHAT DO I NEED TO KNOW ABOUT THE DASH EATING PLAN? For the DASH eating plan, you will follow these general guidelines:  Choose foods with a percent daily value for sodium of less than 5% (as listed on the food label).  Use salt-free seasonings or herbs instead of table salt or sea salt.  Check with your health care provider or pharmacist before using salt substitutes.  Eat lower-sodium products, often labeled as "lower sodium" or "no salt added."  Eat fresh foods.  Eat more vegetables, fruits, and low-fat dairy products.  Choose whole grains. Look for the word "whole" as the first word in the ingredient list.  Choose fish and skinless chicken or [place] more  often than red meat. Limit fish, poultry, and meat to 6 oz (170 g) each day.  Limit sweets, desserts, sugars, and sugary drinks.  Choose heart-healthy fats.  Limit cheese to 1 oz (28 g) per day.  Eat more home-cooked food and less restaurant, buffet, and fast food.  Limit fried foods.  Cook foods using methods other than frying.  Limit canned vegetables. If you do use them, rinse them well to decrease the sodium.  When eating at a restaurant, ask that your food be prepared with less salt, or no salt if possible. WHAT FOODS CAN I EAT? Seek help from a dietitian for individual  calorie needs. Grains Whole grain or whole wheat bread. Brown rice. Whole grain or whole wheat pasta. Quinoa, bulgur, and whole grain cereals. Low-sodium cereals. Corn or whole wheat flour tortillas. Whole grain cornbread. Whole grain crackers. Low-sodium crackers. Vegetables Fresh or frozen vegetables (raw, steamed, roasted, or grilled). Low-sodium or reduced-sodium tomato and vegetable juices. Low-sodium or reduced-sodium tomato sauce and paste. Low-sodium or reduced-sodium canned vegetables.  Fruits All fresh, canned (in natural juice), or frozen fruits. Meat and Other Protein Products Ground beef (85% or leaner), grass-fed beef, or beef trimmed of fat. Skinless chicken or [place]. Ground chicken or [place]. Pork trimmed of fat. All fish and seafood. Eggs. Dried beans, peas, or lentils. Unsalted nuts and seeds. Unsalted canned beans. Dairy Low-fat dairy products, such as skim or 1% milk, 2% or reduced-fat cheeses, low-fat ricotta or cottage cheese, or plain low-fat yogurt. Low-sodium or reduced-sodium cheeses. Fats and Oils Tub margarines without trans fats. Light or reduced-fat mayonnaise and salad dressings (reduced sodium). Avocado. Safflower, olive, or canola oils. Natural peanut or almond butter. Other Unsalted popcorn and pretzels. The items listed above may not be a complete list of recommended foods or beverages. Contact your dietitian for more options. WHAT FOODS ARE NOT RECOMMENDED? Grains White bread. White pasta. White rice. Refined cornbread. Bagels and croissants. Crackers that contain trans fat. Vegetables Creamed or fried vegetables. Vegetables in a cheese sauce. Regular canned vegetables. Regular canned tomato sauce and paste. Regular tomato and vegetable juices. Fruits Dried fruits. Canned fruit in light or heavy syrup. Fruit juice. Meat and Other Protein Products Fatty cuts of meat. Ribs, chicken wings, bacon, sausage, bologna, salami, chitterlings, fatback, hot dogs,  bratwurst, and packaged luncheon meats. Salted nuts and seeds. Canned beans with salt. Dairy Whole or 2% milk, cream, half-and-half, and cream cheese. Whole-fat or sweetened yogurt. Full-fat cheeses or blue cheese. Nondairy creamers and whipped toppings. Processed cheese, cheese spreads, or cheese curds. Condiments Onion and garlic salt, seasoned salt, table salt, and sea salt. Canned and packaged gravies. Worcestershire sauce. Tartar sauce. Barbecue sauce. Teriyaki sauce. Soy sauce, including reduced sodium. Steak sauce. Fish sauce. Oyster sauce. Cocktail sauce. Horseradish. Ketchup and mustard. Meat flavorings and tenderizers. Bouillon cubes. Hot sauce. Tabasco sauce. Marinades. Taco seasonings. Relishes. Fats and Oils Butter, stick margarine, lard, shortening, ghee, and bacon fat. Coconut, palm kernel, or palm oils. Regular salad dressings. Other Pickles and olives. Salted popcorn and pretzels. The items listed above may not be a complete list of foods and beverages to avoid. Contact your dietitian for more information. WHERE CAN I FIND MORE INFORMATION? National Heart, Lung, and Blood Institute: travelstabloid.com Document Released: 03/20/2011 Document Revised: 08/15/2013 Document Reviewed: 02/02/2013 [facility] Patient Information 2015 [place], [place]. This information is not intended to replace advice given to you by your health care provider. Make sure you discuss any questions you have with your  health care provider.   Edema Edema is an abnormal buildup of fluids in your bodytissues. Edema is somewhatdependent on gravity to pull the fluid to the lowest place in your body. That makes the condition more common in the legs and thighs (lower extremities). Painless swelling of the feet and ankles is common and becomes more likely as you get older. It is also common in looser tissues, like around your eyes. When the affected area is squeezed, the fluid may move  out of that spot and leave a dent for a few moments. This dent is called pitting. What are the causes? There are many possible causes of edema. Eating too much salt and being on your feet or sitting for a long time can cause edema in your legs and ankles. Hot weather may make edema worse. Common medical causes of edema include:  Heart failure.  Liver disease.  Kidney disease.  Weak blood vessels in your legs.  Cancer.  An injury.  Pregnancy.  Some medications.  Obesity.  What are the signs or symptoms? Edema is usually painless.Your skin may look swollen or shiny. How is this diagnosed? Your health care provider may be able to diagnose edema by asking about your medical history and doing a physical exam. You may need to have tests such as X-rays, an electrocardiogram, or blood tests to check for medical conditions that may cause edema. How is this treated? Edema treatment depends on the cause. If you have heart, liver, or kidney disease, you need the treatment appropriate for these conditions. General treatment may include:  Elevation of the affected body part above the level of your heart.  Compression of the affected body part. Pressure from elastic bandages or support stockings squeezes the tissues and forces fluid back into the blood vessels. This keeps fluid from entering the tissues.  Restriction of fluid and salt intake.  Use of a water pill (diuretic). These medications are appropriate only for some types of edema. They pull fluid out of your body and make you urinate more often. This gets rid of fluid and reduces swelling, but diuretics can have side effects. Only use diuretics as directed by your health care provider.  Follow these instructions at home:  Keep the affected body part above the level of your heart when you are lying down.  Do not sit still or stand for prolonged periods.  Do not put anything directly under your knees when lying down.  Do not wear  constricting clothing or garters on your upper legs.  Exercise your legs to work the fluid back into your blood vessels. This may help the swelling go down.  Wear elastic bandages or support stockings to reduce ankle swelling as directed by your health care provider.  Eat a low-salt diet to reduce fluid if your health care provider recommends it.  Only take medicines as directed by your health care provider. Contact a health care provider if:  Your edema is not responding to treatment.  You have heart, liver, or kidney disease and notice symptoms of edema.  You have edema in your legs that does not improve after elevating them.  You have sudden and unexplained weight gain. Get help right away if:  You develop shortness of breath or chest pain.  You cannot breathe when you lie down.  You develop pain, redness, or warmth in the swollen areas.  You have heart, liver, or kidney disease and suddenly get edema.  You have a fever and your symptoms  suddenly get worse. This information is not intended to replace advice given to you by your health care provider. Make sure you discuss any questions you have with your health care provider. Document Released: 03/31/2005 Document Revised: 09/06/2015 Document Reviewed: 01/21/2013 Elsevier Interactive Patient Education  2017 [employer].  Drink 80-100 oz a day of water, measure it out Eat 3 meals a day, have to do breakfast, eat protein- hard boiled eggs, protein bar like [place] protein bar, greek yogurt like oikos triple zero, chobani 100, or light n fit greek  We want weight loss that will last so you should lose 1-2 pounds a week.  THAT IS IT! Please pick THREE things a month to change. Once it is a habit check off the item. Then pick another three items off the list to become habits.  If you are already doing a habit on the list GREAT!  Cross that item off! o Don't drink your calories. Ie, alcohol, soda, fruit juice, and sweet tea.   o Drink more water. Drink a glass when you feel hungry or before each meal.  o Eat breakfast - Complex carb and protein (likeDannon light and fit yogurt, oatmeal, fruit, eggs, [place] bacon). o Measure your cereal.  Eat no more than one cup a day. (ie [place]) o Eat an apple a day. o Add a vegetable a day. o Try a new vegetable a month. o Use Pam! Stop using oil or butter to cook. o Don't finish your plate or use smaller plates. o Share your dessert. o Eat sugar free Jello for dessert or frozen grapes. o Don't eat 2-3 hours before bed. o Switch to whole wheat bread, pasta, and brown rice. o Make healthier choices when you eat out. No fries! o Pick baked chicken, NOT fried. o Don't forget to SLOW DOWN when you eat. It is not going anywhere.  o Take the stairs. o Park far away in the parking lot o [employer] (or weights) for 10 minutes while watching TV. o Walk at work for 10 minutes during break. o Walk outside 1 time a week with your friend, kids, dog, or significant other. o Start a walking group at [place] the mall as much as you can tolerate.  o Keep a food diary. o Weigh yourself daily. o Walk for 15 minutes 3 days per week. o Cook at home more often and eat out less.  If life happens and you go back to old habits, it is okay.  Just start over. You can do it!   If you experience chest pain, get short of breath, or tired during the exercise, please stop immediately and inform your doctor.    Simple math prevails.    1st - exercise does not produce significant weight loss - at best one converts fat into muscle , "bulks up", loses inches, but usually stays "weight neutral"     2nd - think of your body weightas a check book: If you eat more calories than you burn up - you save money or gain weight .... Or if you spend more money than you put in the check book, ie burn up more calories than you eat, then you lose weight     3rd - if you walk or run 1 mile, you burn up  100 calories - you have to burn up 3,500 calories to lose 1 pound, ie you have to walk/run 35 miles to lose 1 measly pound. So if you  want to lose 10 #, then you have to walk/run 350 miles, so.... clearly exercise is not the solution.     4. So if you consume 1,500 calories, then you have to burn up the equivalent of 15 miles to stay weight neutral - It also stands to reason that if you consume 1,500 cal/day and don't lose weight, then you must be burning up about 1,500 cals/day to stay weight neutral.     5. If you really want to lose weight, you must cut your calorie intake 300 calories /day and at that rate you should lose about 1 # every 3 days.   6. Please purchase Dr Fara Olden Fuhrman's book(s) "The End of Dieting" & "Eat to Live" . It has some great concepts and recipes.      Cholelithiasis Cholelithiasis is a form of gallbladder disease in which gallstones form in the gallbladder. The gallbladder is an organ that stores bile. Bile is made in the liver, and it helps to digest fats. Gallstones begin as small crystals and slowly grow into stones. They may cause no symptoms until the gallbladder tightens (contracts) and a gallstone is blocking the duct (gallbladder attack), which can cause pain. Cholelithiasis is also referred to as gallstones. There are two main types of gallstones:  Cholesterol stones. These are made of hardened cholesterol and are usually yellow-green in color. They are the most common type of gallstone. Cholesterol is a white, waxy, fat-like substance that is made in the liver.  Pigment stones. These are dark in color and are made of a red-yellow substance that forms when hemoglobin from red blood cells breaks down (bilirubin).  What are the causes? This condition may be caused by an imbalance in the substances that bile is made of. This can happen if the bile:  Has too much bilirubin.  Has too much cholesterol.  Does not have enough bile salts. These salts help the body  absorb and digest fats.  In some cases, this condition can also be caused by the gallbladder not emptying completely or often enough. What increases the risk? The following factors may make you more likely to develop this condition:  Being female.  Having multiple pregnancies. Health care providers sometimes advise removing diseased gallbladders before future pregnancies.  Eating a diet that is heavy in fried foods, fat, and refined carbohydrates, like white bread and white rice.  Being obese.  Being older than age 58.  Prolonged use of medicines that contain female hormones (estrogen).  Having diabetes mellitus.  Rapidly losing weight.  Having a family history of gallstones.  Being of [place] or [place] descent.  Having an intestinal disease such as Crohn disease.  Having metabolic syndrome.  Having cirrhosis.  Having severe types of anemia such as sickle cell anemia.  What are the signs or symptoms? In most cases, there are no symptoms. These are known as silent gallstones. If a gallstone blocks the bile ducts, it can cause a gallbladder attack. The main symptom of a gallbladder attack is sudden pain in the upper right abdomen. The pain usually comes at night or after eating a large meal. The pain can last for one or several hours and can spread to the right shoulder or chest. If the bile duct is blocked for more than a few hours, it can cause infection or inflammation of the gallbladder, liver, or pancreas, which may cause:  Nausea.  Vomiting.  Abdominal pain that lasts for 5 hours or more.  Fever or  chills.  Yellowing of the skin or the whites of the eyes (jaundice).  Dark urine.  Light-colored stools.  How is this diagnosed? This condition may be diagnosed based on:  A physical exam.  Your medical history.  An ultrasound of your gallbladder.  CT scan.  MRI.  Blood tests to check for signs of infection or inflammation.  A scan of your  gallbladder and bile ducts (biliary system) using nonharmful radioactive material and special cameras that can see the radioactive material (cholescintigram). This test checks to see how your gallbladder contracts and whether bile ducts are blocked.  Inserting a small tube with a camera on the end (endoscope) through your mouth to inspect bile ducts and check for blockages (endoscopic retrograde cholangiopancreatogram).  How is this treated? Treatment for gallstones depends on the severity of the condition. Silent gallstones do not need treatment. If the gallstones cause a gallbladder attack or other symptoms, treatment may be required. Options for treatment include:  Surgery to remove the gallbladder (cholecystectomy). This is the most common treatment.  Medicines to dissolve gallstones. These are most effective at treating small gallstones. You may need to take medicines for up to 6-12 months.  Shock wave treatment (extracorporeal biliary lithotripsy). In this treatment, an ultrasound machine sends shock waves to the gallbladder to break gallstones into smaller pieces. These pieces can then be passed into the intestines or be dissolved by medicine. This is rarely used.  Removing gallstones through endoscopic retrograde cholangiopancreatogram. A small basket can be attached to the endoscope and used to capture and remove gallstones.  Follow these instructions at home:  Take over-the-counter and prescription medicines only as told by your health care provider.  Maintain a healthy weight and follow a healthy diet. This includes: ? Reducing fatty foods, such as fried food. ? Reducing refined carbohydrates, like white bread and white rice. ? Increasing fiber. Aim for foods like almonds, fruit, and beans.  Keep all follow-up visits as told by your health care provider. This is important. Contact a health care provider if:  You think you have had a gallbladder attack.  You have been diagnosed  with silent gallstones and you develop abdominal pain or indigestion. Get help right away if:  You have pain from a gallbladder attack that lasts for more than 2 hours.  You have abdominal pain that lasts for more than 5 hours.  You have a fever or chills.  You have persistent nausea and vomiting.  You develop jaundice.  You have dark urine or light-colored stools. Summary  Cholelithiasis (also called gallstones) is a form of gallbladder disease in which gallstones form in the gallbladder.  This condition is caused by an imbalance in the substances that make up bile. This can happen if the bile has too much cholesterol, too much bilirubin, or not enough bile salts.  You are more likely to develop this condition if you are female, pregnant, using medicines with estrogen, obese, older than age 57, or have a family history of gallstones. You may also develop gallstones if you have diabetes, an intestinal disease, cirrhosis, or metabolic syndrome.  Treatment for gallstones depends on the severity of the condition. Silent gallstones do not need treatment.  If gallstones cause a gallbladder attack or other symptoms, treatment may be needed. The most common treatment is surgery to remove the gallbladder. This information is not intended to replace advice given to you by your health care provider. Make sure you discuss any questions you have with your  health care provider. Document Released: 03/27/2005 Document Revised: 12/16/2015 Document Reviewed: 12/16/2015 Elsevier Interactive Patient Education  2017 [employer].

## 2016-11-29 ENCOUNTER — Encounter (HOSPITAL_COMMUNITY): Payer: Self-pay | Admitting: Emergency Medicine

## 2016-11-29 DIAGNOSIS — R109 Unspecified abdominal pain: Secondary | ICD-10-CM | POA: Insufficient documentation

## 2016-11-29 DIAGNOSIS — Z5321 Procedure and treatment not carried out due to patient leaving prior to being seen by health care provider: Secondary | ICD-10-CM | POA: Insufficient documentation

## 2016-11-29 LAB — HEPATIC FUNCTION PANEL
AG RATIO: 1.2 (calc) (ref 1.0–2.5)
ALKALINE PHOSPHATASE (APISO): 85 U/L (ref 33–130)
ALT: 13 U/L (ref 6–29)
AST: 16 U/L (ref 10–35)
Albumin: 4.2 g/dL (ref 3.6–5.1)
BILIRUBIN INDIRECT: 0.2 mg/dL (ref 0.2–1.2)
Bilirubin, Direct: 0.1 mg/dL (ref 0.0–0.2)
Globulin: 3.5 g/dL (calc) (ref 1.9–3.7)
TOTAL PROTEIN: 7.7 g/dL (ref 6.1–8.1)
Total Bilirubin: 0.3 mg/dL (ref 0.2–1.2)

## 2016-11-29 LAB — TSH: TSH: 0.85 mIU/L

## 2016-11-29 LAB — URINE CULTURE
MICRO NUMBER:: 80895029
RESULT: NO GROWTH
SPECIMEN QUALITY: ADEQUATE

## 2016-11-29 LAB — HEPATITIS C ANTIBODY
HEP C AB: NONREACTIVE
SIGNAL TO CUT-OFF: 0.07 (ref <1.00)

## 2016-11-29 LAB — URINALYSIS, ROUTINE W REFLEX MICROSCOPIC
BILIRUBIN URINE: NEGATIVE
BILIRUBIN URINE: NEGATIVE
Bacteria, UA: NONE SEEN /HPF
GLUCOSE, UA: NEGATIVE
Glucose, UA: NEGATIVE mg/dL
HGB URINE DIPSTICK: NEGATIVE
HGB URINE DIPSTICK: NEGATIVE
HYALINE CAST: NONE SEEN /LPF
KETONES UR: NEGATIVE
KETONES UR: NEGATIVE mg/dL
NITRITE: NEGATIVE
Nitrite: NEGATIVE
PH: 6 (ref 5.0–8.0)
PROTEIN: NEGATIVE
Protein, ur: NEGATIVE mg/dL
RBC / HPF: NONE SEEN /HPF (ref 0–2)
SPECIFIC GRAVITY, URINE: 1.016 (ref 1.005–1.030)
Specific Gravity, Urine: 1.008 (ref 1.001–1.03)
WBC UA: NONE SEEN /HPF (ref 0–5)
pH: 7.5 (ref 5.0–8.0)

## 2016-11-29 LAB — COMPREHENSIVE METABOLIC PANEL
ALT: 17 U/L (ref 14–54)
AST: 18 U/L (ref 15–41)
Albumin: 4 g/dL (ref 3.5–5.0)
Alkaline Phosphatase: 77 U/L (ref 38–126)
Anion gap: 7 (ref 5–15)
BILIRUBIN TOTAL: 0.5 mg/dL (ref 0.3–1.2)
BUN: 9 mg/dL (ref 6–20)
CHLORIDE: 103 mmol/L (ref 101–111)
CO2: 28 mmol/L (ref 22–32)
Calcium: 9.7 mg/dL (ref 8.9–10.3)
Creatinine, Ser: 0.98 mg/dL (ref 0.44–1.00)
Glucose, Bld: 108 mg/dL — ABNORMAL HIGH (ref 65–99)
POTASSIUM: 4.2 mmol/L (ref 3.5–5.1)
Sodium: 138 mmol/L (ref 135–145)
TOTAL PROTEIN: 7.9 g/dL (ref 6.5–8.1)

## 2016-11-29 LAB — BASIC METABOLIC PANEL WITH GFR
BUN: 9 mg/dL (ref 7–25)
CO2: 20 mmol/L (ref 20–32)
CREATININE: 0.84 mg/dL (ref 0.50–1.05)
Calcium: 10.4 mg/dL (ref 8.6–10.4)
Chloride: 101 mmol/L (ref 98–110)
GFR, Est African American: 91 mL/min/{1.73_m2} (ref >=60)
GFR, Est Non African American: 78 mL/min/{1.73_m2} (ref >=60)
GLUCOSE: 87 mg/dL (ref 65–99)
POTASSIUM: 4.2 mmol/L (ref 3.5–5.3)
SODIUM: 139 mmol/L (ref 135–146)

## 2016-11-29 LAB — MICROALBUMIN / CREATININE URINE RATIO
Creatinine, Urine: 62 mg/dL (ref 20–320)
MICROALB/CREAT RATIO: 3 ug/mg{creat} (ref <30)
Microalb, Ur: 0.2 mg/dL

## 2016-11-29 LAB — MAGNESIUM: Magnesium: 1.9 mg/dL (ref 1.5–2.5)

## 2016-11-29 LAB — IRON, TOTAL/TOTAL IRON BINDING CAP
%SAT: 20 % (ref 11–50)
Iron: 75 ug/dL (ref 45–160)
TIBC: 375 mcg/dL (calc) (ref 250–450)

## 2016-11-29 LAB — CBC
HEMATOCRIT: 37.5 % (ref 36.0–46.0)
Hemoglobin: 12.2 g/dL (ref 12.0–15.0)
MCH: 26.8 pg (ref 26.0–34.0)
MCHC: 32.5 g/dL (ref 30.0–36.0)
MCV: 82.4 fL (ref 78.0–100.0)
PLATELETS: 292 10*3/uL (ref 150–400)
RBC: 4.55 MIL/uL (ref 3.87–5.11)
RDW: 14.9 % (ref 11.5–15.5)
WBC: 6.6 10*3/uL (ref 4.0–10.5)

## 2016-11-29 LAB — LIPID PANEL
CHOL/HDL RATIO: 3.2 (calc) (ref <5.0)
CHOLESTEROL: 210 mg/dL — AB (ref <200)
HDL: 65 mg/dL (ref >50)
LDL Cholesterol (Calc): 125 mg/dL (calc) — ABNORMAL HIGH
Non-HDL Cholesterol (Calc): 145 mg/dL (calc) — ABNORMAL HIGH (ref <130)
Triglycerides: 98 mg/dL (ref <150)

## 2016-11-29 LAB — VITAMIN B12: Vitamin B-12: 838 pg/mL (ref 200–1100)

## 2016-11-29 LAB — HEMOGLOBIN A1C

## 2016-11-29 LAB — HIV ANTIBODY (ROUTINE TESTING W REFLEX): HIV: NONREACTIVE

## 2016-11-29 LAB — CBC WITH DIFFERENTIAL/PLATELET

## 2016-11-29 LAB — VITAMIN D 25 HYDROXY (VIT D DEFICIENCY, FRACTURES): Vit D, 25-Hydroxy: 20 ng/mL — ABNORMAL LOW (ref 30–100)

## 2016-11-29 LAB — AMYLASE: AMYLASE: 58 U/L (ref 21–101)

## 2016-11-29 LAB — LIPASE, BLOOD: LIPASE: 31 U/L (ref 11–51)

## 2016-11-29 NOTE — ED Triage Notes (Signed)
Pt presents with abd pain r/t gallstones; pt was seen yesterday by PCP and had US done which showed inflammation and gallstones; pt currently scheduling surgery for gallbladder surgery; pt denies nausea, fevers, or diahrrea

## 2016-11-30 ENCOUNTER — Emergency Department (HOSPITAL_COMMUNITY)
Admission: EM | Admit: 2016-11-30 | Discharge: 2016-11-30 | Disposition: A | Discharge status: Home / Self Care | Payer: BLUE CROSS/BLUE SHIELD | Attending: Emergency Medicine | Admitting: Emergency Medicine

## 2016-12-01 ENCOUNTER — Other Ambulatory Visit: Payer: Self-pay | Admitting: Physician Assistant

## 2016-12-01 DIAGNOSIS — K819 Cholecystitis, unspecified: Secondary | ICD-10-CM

## 2016-12-01 NOTE — Progress Notes (Signed)
Pt aware of lab results & voiced understanding of those results. Pt has an appt with GEN. SURGEON on Aug 24th 2018

## 2016-12-11 ENCOUNTER — Encounter (HOSPITAL_BASED_OUTPATIENT_CLINIC_OR_DEPARTMENT_OTHER): Payer: Self-pay | Admitting: *Deleted

## 2016-12-11 ENCOUNTER — Ambulatory Visit: Payer: Self-pay | Admitting: General Surgery

## 2016-12-11 NOTE — Progress Notes (Signed)
NPO AFTER MN.  ARRIVE AT [id number].  CURRENT LAB RESULTS IN CHART AND EPIC.  WILL TAKE PRILOSEC AM DOS W/ SIPS OF WATER.

## 2016-12-17 ENCOUNTER — Ambulatory Visit (HOSPITAL_BASED_OUTPATIENT_CLINIC_OR_DEPARTMENT_OTHER): Payer: BLUE CROSS/BLUE SHIELD | Admitting: Anesthesiology

## 2016-12-17 ENCOUNTER — Encounter (HOSPITAL_BASED_OUTPATIENT_CLINIC_OR_DEPARTMENT_OTHER): Payer: Self-pay | Admitting: Anesthesiology

## 2016-12-17 ENCOUNTER — Encounter (HOSPITAL_BASED_OUTPATIENT_CLINIC_OR_DEPARTMENT_OTHER)
Admission: RE | Disposition: A | Discharge status: Home / Self Care | Payer: Self-pay | Source: Ambulatory Visit | Attending: General Surgery

## 2016-12-17 ENCOUNTER — Ambulatory Visit (HOSPITAL_BASED_OUTPATIENT_CLINIC_OR_DEPARTMENT_OTHER)
Admission: RE | Admit: 2016-12-17 | Discharge: 2016-12-17 | Disposition: A | Discharge status: Home / Self Care | Payer: BLUE CROSS/BLUE SHIELD | Source: Ambulatory Visit | Attending: General Surgery | Admitting: General Surgery

## 2016-12-17 DIAGNOSIS — K219 Gastro-esophageal reflux disease without esophagitis: Secondary | ICD-10-CM | POA: Diagnosis not present

## 2016-12-17 DIAGNOSIS — K8010 Calculus of gallbladder with chronic cholecystitis without obstruction: Secondary | ICD-10-CM | POA: Insufficient documentation

## 2016-12-17 DIAGNOSIS — Z6841 Body Mass Index (BMI) 40.0 and over, adult: Secondary | ICD-10-CM | POA: Insufficient documentation

## 2016-12-17 DIAGNOSIS — Z888 Allergy status to other drugs, medicaments and biological substances status: Secondary | ICD-10-CM | POA: Diagnosis not present

## 2016-12-17 DIAGNOSIS — K828 Other specified diseases of gallbladder: Secondary | ICD-10-CM | POA: Insufficient documentation

## 2016-12-17 DIAGNOSIS — Z841 Family history of disorders of kidney and ureter: Secondary | ICD-10-CM | POA: Insufficient documentation

## 2016-12-17 DIAGNOSIS — Z8249 Family history of ischemic heart disease and other diseases of the circulatory system: Secondary | ICD-10-CM | POA: Insufficient documentation

## 2016-12-17 DIAGNOSIS — K5900 Constipation, unspecified: Secondary | ICD-10-CM | POA: Insufficient documentation

## 2016-12-17 DIAGNOSIS — Z90721 Acquired absence of ovaries, unilateral: Secondary | ICD-10-CM | POA: Diagnosis not present

## 2016-12-17 DIAGNOSIS — Z823 Family history of stroke: Secondary | ICD-10-CM | POA: Insufficient documentation

## 2016-12-17 DIAGNOSIS — Z87891 Personal history of nicotine dependence: Secondary | ICD-10-CM | POA: Diagnosis not present

## 2016-12-17 DIAGNOSIS — M1711 Unilateral primary osteoarthritis, right knee: Secondary | ICD-10-CM | POA: Diagnosis not present

## 2016-12-17 DIAGNOSIS — Z91018 Allergy to other foods: Secondary | ICD-10-CM | POA: Diagnosis not present

## 2016-12-17 HISTORY — DX: Personal history of other diseases of the female genital tract: Z87.42

## 2016-12-17 HISTORY — PX: CHOLECYSTECTOMY: SHX55

## 2016-12-17 HISTORY — DX: Nontoxic goiter, unspecified: E04.9

## 2016-12-17 HISTORY — DX: Family history of other specified conditions: Z84.89

## 2016-12-17 HISTORY — DX: Calculus of bile duct without cholangitis or cholecystitis without obstruction: K80.50

## 2016-12-17 HISTORY — DX: Gastro-esophageal reflux disease without esophagitis: K21.9

## 2016-12-17 HISTORY — DX: Constipation, unspecified: K59.00

## 2016-12-17 HISTORY — DX: Localized edema: R60.0

## 2016-12-17 HISTORY — DX: Presence of spectacles and contact lenses: Z97.3

## 2016-12-17 HISTORY — DX: Personal history of other complications of pregnancy, childbirth and the puerperium: Z87.59

## 2016-12-17 HISTORY — DX: Calculus of gallbladder without cholecystitis without obstruction: K80.20

## 2016-12-17 HISTORY — DX: Unspecified osteoarthritis, unspecified site: M19.90

## 2016-12-17 SURGERY — LAPAROSCOPIC CHOLECYSTECTOMY
Anesthesia: General

## 2016-12-17 MED ORDER — LACTATED RINGERS IV SOLN
INTRAVENOUS | Status: DC
  Administered 2016-12-17 (×4): via INTRAVENOUS
  Filled 2016-12-17: qty 1000

## 2016-12-17 MED ORDER — IBUPROFEN 800 MG PO TABS: 800.0000 mg | ORAL_TABLET | Freq: Three times a day (TID) | ORAL | 0 refills | Status: DC | PRN

## 2016-12-17 MED ORDER — PROPOFOL 10 MG/ML IV BOLUS
INTRAVENOUS | Status: AC
  Filled 2016-12-17: qty 20

## 2016-12-17 MED ORDER — FENTANYL CITRATE (PF) 100 MCG/2ML IJ SOLN
INTRAMUSCULAR | Status: DC | PRN
  Administered 2016-12-17 (×2): 100 ug via INTRAVENOUS
  Administered 2016-12-17: 50 ug via INTRAVENOUS

## 2016-12-17 MED ORDER — CHLORHEXIDINE GLUCONATE CLOTH 2 % EX PADS
6.0000 | MEDICATED_PAD | Freq: Once | CUTANEOUS | Status: DC
  Filled 2016-12-17: qty 6

## 2016-12-17 MED ORDER — FENTANYL CITRATE (PF) 250 MCG/5ML IJ SOLN
INTRAMUSCULAR | Status: AC
  Filled 2016-12-17: qty 5

## 2016-12-17 MED ORDER — HYDROMORPHONE HCL 1 MG/ML IJ SOLN
0.2500 mg | INTRAMUSCULAR | Status: DC | PRN
  Administered 2016-12-17: 0.5 mg via INTRAVENOUS
  Administered 2016-12-17 (×2): 0.25 mg via INTRAVENOUS
  Filled 2016-12-17: qty 0.5

## 2016-12-17 MED ORDER — SCOPOLAMINE 1 MG/3DAYS TD PT72
1.0000 | MEDICATED_PATCH | TRANSDERMAL | Status: DC
  Administered 2016-12-17: 1 via TRANSDERMAL
  Administered 2016-12-17: 1.5 mg via TRANSDERMAL
  Filled 2016-12-17: qty 1

## 2016-12-17 MED ORDER — ONDANSETRON HCL 4 MG/2ML IJ SOLN
INTRAMUSCULAR | Status: AC
  Filled 2016-12-17: qty 2

## 2016-12-17 MED ORDER — BUPIVACAINE HCL (PF) 0.5 % IJ SOLN
INTRAMUSCULAR | Status: AC
Start: 2016-12-17 — End: 2016-12-17
  Filled 2016-12-17: qty 30

## 2016-12-17 MED ORDER — SUGAMMADEX SODIUM 200 MG/2ML IV SOLN
INTRAVENOUS | Status: AC
  Filled 2016-12-17: qty 2

## 2016-12-17 MED ORDER — ROCURONIUM BROMIDE 50 MG/5ML IV SOSY
PREFILLED_SYRINGE | INTRAVENOUS | Status: AC
  Filled 2016-12-17: qty 5

## 2016-12-17 MED ORDER — LIDOCAINE 2% (20 MG/ML) 5 ML SYRINGE
INTRAMUSCULAR | Status: AC
  Filled 2016-12-17: qty 5

## 2016-12-17 MED ORDER — MIDAZOLAM HCL 2 MG/2ML IJ SOLN
INTRAMUSCULAR | Status: AC
  Filled 2016-12-17: qty 2

## 2016-12-17 MED ORDER — HYDROMORPHONE HCL-NACL 0.5-0.9 MG/ML-% IV SOSY
PREFILLED_SYRINGE | INTRAVENOUS | Status: AC
  Filled 2016-12-17: qty 1

## 2016-12-17 MED ORDER — SCOPOLAMINE 1 MG/3DAYS TD PT72
MEDICATED_PATCH | TRANSDERMAL | Status: AC
  Filled 2016-12-17: qty 1

## 2016-12-17 MED ORDER — SUCCINYLCHOLINE CHLORIDE 20 MG/ML IJ SOLN
INTRAMUSCULAR | Status: DC | PRN
  Administered 2016-12-17: 160 mg via INTRAVENOUS

## 2016-12-17 MED ORDER — BUPIVACAINE HCL 0.5 % IJ SOLN
INTRAMUSCULAR | Status: DC | PRN
  Administered 2016-12-17: 30 mL

## 2016-12-17 MED ORDER — DEXAMETHASONE SODIUM PHOSPHATE 4 MG/ML IJ SOLN
INTRAMUSCULAR | Status: DC | PRN
  Administered 2016-12-17: 10 mg via INTRAVENOUS

## 2016-12-17 MED ORDER — SUGAMMADEX SODIUM 200 MG/2ML IV SOLN
INTRAVENOUS | Status: DC | PRN
  Administered 2016-12-17: 200 mg via INTRAVENOUS

## 2016-12-17 MED ORDER — SUCCINYLCHOLINE CHLORIDE 200 MG/10ML IV SOSY
PREFILLED_SYRINGE | INTRAVENOUS | Status: AC
  Filled 2016-12-17: qty 10

## 2016-12-17 MED ORDER — MEPERIDINE HCL 25 MG/ML IJ SOLN
6.2500 mg | INTRAMUSCULAR | Status: DC | PRN
  Filled 2016-12-17: qty 1

## 2016-12-17 MED ORDER — CEFOTETAN DISODIUM-DEXTROSE 2-2.08 GM-% IV SOLR
INTRAVENOUS | Status: AC
  Filled 2016-12-17: qty 50

## 2016-12-17 MED ORDER — SODIUM CHLORIDE 0.9 % IR SOLN
Status: DC | PRN
  Administered 2016-12-17: 2000 mL

## 2016-12-17 MED ORDER — METOCLOPRAMIDE HCL 5 MG/ML IJ SOLN
10.0000 mg | Freq: Once | INTRAMUSCULAR | Status: DC | PRN
  Filled 2016-12-17: qty 2

## 2016-12-17 MED ORDER — PROPOFOL 10 MG/ML IV BOLUS
INTRAVENOUS | Status: DC | PRN
  Administered 2016-12-17: 200 mg via INTRAVENOUS
  Administered 2016-12-17 (×2): 50 mg via INTRAVENOUS

## 2016-12-17 MED ORDER — ROCURONIUM BROMIDE 100 MG/10ML IV SOLN
INTRAVENOUS | Status: DC | PRN
  Administered 2016-12-17: 50 mg via INTRAVENOUS

## 2016-12-17 MED ORDER — LABETALOL HCL 5 MG/ML IV SOLN
INTRAVENOUS | Status: DC | PRN
  Administered 2016-12-17 (×3): 5 mg via INTRAVENOUS

## 2016-12-17 MED ORDER — HYDROCODONE-ACETAMINOPHEN 7.5-325 MG PO TABS
1.0000 | ORAL_TABLET | Freq: Once | ORAL | Status: DC | PRN
  Filled 2016-12-17: qty 1

## 2016-12-17 MED ORDER — HYDROCODONE-ACETAMINOPHEN 5-325 MG PO TABS
ORAL_TABLET | ORAL | Status: AC
  Filled 2016-12-17: qty 1

## 2016-12-17 MED ORDER — DEXAMETHASONE SODIUM PHOSPHATE 10 MG/ML IJ SOLN
INTRAMUSCULAR | Status: AC
  Filled 2016-12-17: qty 1

## 2016-12-17 MED ORDER — ONDANSETRON HCL 4 MG/2ML IJ SOLN
INTRAMUSCULAR | Status: DC | PRN
  Administered 2016-12-17: 4 mg via INTRAVENOUS

## 2016-12-17 MED ORDER — HYDROCODONE-ACETAMINOPHEN 5-325 MG PO TABS
1.0000 | ORAL_TABLET | Freq: Once | ORAL | Status: AC
  Administered 2016-12-17: 1 via ORAL
  Filled 2016-12-17: qty 1

## 2016-12-17 MED ORDER — KETOROLAC TROMETHAMINE 30 MG/ML IJ SOLN
INTRAMUSCULAR | Status: DC | PRN
  Administered 2016-12-17: 30 mg via INTRAVENOUS

## 2016-12-17 MED ORDER — HYDROCODONE-ACETAMINOPHEN 5-325 MG PO TABS: 1.0000 | ORAL_TABLET | Freq: Four times a day (QID) | ORAL | 0 refills | Status: DC | PRN

## 2016-12-17 MED ORDER — DEXTROSE 5 % IV SOLN
2.0000 g | INTRAVENOUS | Status: AC
  Administered 2016-12-17: 2 g via INTRAVENOUS
  Filled 2016-12-17: qty 2

## 2016-12-17 MED FILL — IBUPROFEN 800 MG TAB: 800 | 10 days supply | Qty: 30 | Fill #0

## 2016-12-17 MED FILL — HYDROCODON-APAP 5-325: 5-325 | 2 days supply | Qty: 20 | Fill #0

## 2016-12-17 SURGICAL SUPPLY — 46 items
APPLIER CLIP ROT 10 11.4 M/L (STAPLE)
BLADE SURG 11 STRL SS (BLADE) ×2 IMPLANT
CABLE HIGH FREQUENCY MONO STRZ (ELECTRODE) ×2 IMPLANT
CATH CHOLANG 76X19 KUMAR (CATHETERS) IMPLANT
CHLORAPREP W/TINT 26ML (MISCELLANEOUS) ×2 IMPLANT
CLIP APPLIE ROT 10 11.4 M/L (STAPLE) IMPLANT
CLIP LIGATING HEM O LOK PURPLE (MISCELLANEOUS) ×2 IMPLANT
CLIP LIGATING HEMO LOK XL GOLD (MISCELLANEOUS) IMPLANT
COVER BACK TABLE 60X90IN (DRAPES) ×2 IMPLANT
COVER MAYO STAND STRL (DRAPES) ×2 IMPLANT
DECANTER SPIKE VIAL GLASS SM (MISCELLANEOUS) ×2 IMPLANT
DERMABOND ADVANCED (GAUZE/BANDAGES/DRESSINGS) ×1
DERMABOND ADVANCED .7 DNX12 (GAUZE/BANDAGES/DRESSINGS) ×1 IMPLANT
DEVICE PMI PUNCTURE CLOSURE (MISCELLANEOUS) ×2 IMPLANT
DRAIN CHANNEL 19F RND (DRAIN) IMPLANT
DRAPE C-ARM 42X72 X-RAY (DRAPES) IMPLANT
DRAPE LAPAROSCOPIC ABDOMINAL (DRAPES) ×2 IMPLANT
DRAPE UTILITY XL STRL (DRAPES) ×2 IMPLANT
ELECT REM PT RETURN 9FT ADLT (ELECTROSURGICAL) ×2
ELECTRODE REM PT RTRN 9FT ADLT (ELECTROSURGICAL) ×1 IMPLANT
EVACUATOR SILICONE 100CC (DRAIN) IMPLANT
GLOVE BIOGEL PI IND STRL 7.0 (GLOVE) ×1 IMPLANT
GLOVE BIOGEL PI INDICATOR 7.0 (GLOVE) ×1
GLOVE SURG SS PI 7.0 STRL IVOR (GLOVE) ×2 IMPLANT
GOWN STRL REUS W/TWL LRG LVL3 (GOWN DISPOSABLE) ×4 IMPLANT
HEMOSTAT SNOW SURGICEL 2X4 (HEMOSTASIS) IMPLANT
NEEDLE HYPO 22GX1.5 SAFETY (NEEDLE) ×2 IMPLANT
NS IRRIG 500ML POUR BTL (IV SOLUTION) IMPLANT
PACK BASIN DAY SURGERY FS (CUSTOM PROCEDURE TRAY) ×2 IMPLANT
POUCH RETRIEVAL ECOSAC 10 (ENDOMECHANICALS) ×1 IMPLANT
POUCH RETRIEVAL ECOSAC 10MM (ENDOMECHANICALS) ×1
SCISSORS LAP 5X35 DISP (ENDOMECHANICALS) IMPLANT
SET IRRIG TUBING LAPAROSCOPIC (IRRIGATION / IRRIGATOR) ×2 IMPLANT
SHEARS HARMONIC ACE PLUS 36CM (ENDOMECHANICALS) IMPLANT
SOLUTION ANTI FOG 6CC (MISCELLANEOUS) ×2 IMPLANT
STOPCOCK 4 WAY LG BORE MALE ST (IV SETS) IMPLANT
SUT ETHILON 2 0 PS N (SUTURE) IMPLANT
SUT MNCRL AB 4-0 PS2 18 (SUTURE) ×2 IMPLANT
SUT VICRYL 0 ENDOLOOP (SUTURE) IMPLANT
SUT VICRYL 0 UR6 27IN ABS (SUTURE) ×2 IMPLANT
SYR 20CC LL (SYRINGE) IMPLANT
SYR CONTROL 10ML LL (SYRINGE) ×2 IMPLANT
TOWEL OR 17X24 6PK STRL BLUE (TOWEL DISPOSABLE) ×4 IMPLANT
TROCAR BLADELESS OPT 5 100 (ENDOMECHANICALS) ×6 IMPLANT
TROCAR XCEL 12X100 BLDLESS (ENDOMECHANICALS) ×2 IMPLANT
TUBING INSUF HEATED (TUBING) ×2 IMPLANT

## 2016-12-17 NOTE — Anesthesia Postprocedure Evaluation (Signed)
Anesthesia Post Note  Patient: Vicki Shea  Procedure(s) Performed: Procedure(s) (LRB): LAPAROSCOPIC CHOLECYSTECTOMY (N/A)     Patient location during evaluation: PACU Anesthesia Type: General Level of consciousness: awake and alert Pain management: pain level controlled Vital Signs Assessment: post-procedure vital signs reviewed and stable Respiratory status: spontaneous breathing, nonlabored ventilation and respiratory function stable Cardiovascular status: blood pressure returned to baseline and stable Postop Assessment: no signs of nausea or vomiting Anesthetic complications: no    Last Vitals:  Vitals:   12/17/16 1250 12/17/16 1252  BP:    Pulse: 67 61  Resp: (!) 33 20  Temp:    SpO2: 97% 97%    Last Pain:  Vitals:   12/17/16 1252  TempSrc:   PainSc: 7                  Amaru Burroughs A.

## 2016-12-17 NOTE — Op Note (Signed)
PATIENT:  Vicki Shea  56 y.o. female  PRE-OPERATIVE DIAGNOSIS:  CHRONIC CALCULOUS CHOLECYSTITIS   POST-OPERATIVE DIAGNOSIS:  CHRONIC CALCULOUS CHOLECYSTITIS   PROCEDURE:  Procedure(s): LAPAROSCOPIC CHOLECYSTECTOMY   SURGEON:  Surgeon(s): Kinsinger, Arta Bruce, MD  ASSISTANT: none  ANESTHESIA:   local and general  Indications for procedure: [name] is a 56 y.o. female with symptoms of Abdominal pain and Nausea and vomiting consistent with gallbladder disease, Confirmed by Ultrasound.  Description of procedure: The patient was brought into the operative suite, placed supine. Anesthesia was administered with endotracheal tube. Patient was strapped in place and foot board was secured. All pressure points were offloaded by foam padding. The patient was prepped and draped in the usual sterile fashion.  A small incision was made to the right of the umbilicus. A 2mm trocar was inserted into the peritoneal cavity with optical entry. Pneumoperitoneum was applied with high flow low pressure. 2 84mm trocars were placed in the RUQ. A 12mm trocar was placed in the subxiphoid space. All trocars sites were first anesthesized with 0.25% marcaine with epinephrine in the subcutaneous and preperitoneal layers. Next the patient was placed in reverse trendelenberg. The gallbladder was white in color with adhesions of the omentum to the gallbladder, these were taken down with cautery. There were also multiple adhesions bands of the liver to the diaphragm, these were taken down with cautery to allow improved retraction.  The gallbladder was retracted cephalad and lateral. The peritoneum was reflected off the infundibulum working lateral to medial. The cystic duct and cystic artery were identified and further dissection revealed a critical view. In dissection there was some leakage of bile and stones. The cystic duct and cystic artery were doubly clipped and ligated.   The gallbladder was removed  off the liver bed with cautery. The Gallbladder was placed in a specimen bag. The gallbladder fossa was irrigated and hemostasis was applied with cautery. Multiple small stones and bile were suctioned and removed from the abdomen. The gallbladder was removed via the 50mm trocar. The fascial defect was closed with interrupted 0 vicryl suture via laparoscopic trans-fascial suture passer. Pneumoperitoneum was removed, all trocar were removed. All incisions were closed with 4-0 monocryl subcuticular stitch. The patient woke from anesthesia and was brought to PACU in stable condition. All counts were correct  Findings: chronic cholecystitis with a large number of small stones within the gallbladder  Specimen: gallbladder  Blood loss: Total I/O In: 1000 [I.V.:1000] Out: -  ml  Local anesthesia: 30 ml 8.0% marcaine  Complications: none  PLAN OF CARE: Discharge to home after PACU  PATIENT DISPOSITION:  PACU - hemodynamically stable.  Gurney Maxin, M.D. General, Bariatric, & Minimally Invasive Surgery [facility]

## 2016-12-17 NOTE — Transfer of Care (Signed)
  Last Vitals:  Vitals:   12/17/16 0751  BP: 132/83  Pulse: 73  Resp: 18  Temp: 36.8 C  SpO2: 100%    Last Pain:  Vitals:   12/17/16 0751  TempSrc: Oral      Patients Stated Pain Goal: 7 (12/17/16 0905)  Immediate Anesthesia Transfer of Care Note  Patient: Vicki Shea  Procedure(s) Performed: Procedure(s) (LRB): LAPAROSCOPIC CHOLECYSTECTOMY (N/A)  Patient Location: PACU  Anesthesia Type: General  Level of Consciousness: awake, alert  and oriented  Airway & Oxygen Therapy: Patient Spontanous Breathing and Patient connected to nasal cannula oxygen  Post-op Assessment: Report given to PACU RN and Post -op Vital signs reviewed and stable  Post vital signs: Reviewed and stable  Complications: No apparent anesthesia complications

## 2016-12-17 NOTE — Anesthesia Preprocedure Evaluation (Addendum)
Anesthesia Evaluation  Patient identified by MRN, date of birth, ID band Patient awake    Reviewed: Allergy & Precautions, NPO status , Patient's Chart, lab work & pertinent test results  History of Anesthesia Complications (+) Family history of anesthesia reaction  Airway Mallampati: III  TM Distance: >3 FB Neck ROM: Full    Dental no notable dental hx. (+) Teeth Intact, Caps, Dental Advisory Given,    Pulmonary former smoker,    Pulmonary exam normal breath sounds clear to auscultation       Cardiovascular Exercise Tolerance: Good negative cardio ROS Normal cardiovascular exam Rhythm:Regular Rate:Normal     Neuro/Psych negative neurological ROS  negative psych ROS   GI/Hepatic Neg liver ROS, GERD  Medicated and Controlled,Cholelithiasis with chronic cholecystitis   Endo/Other  Morbid obesity  Renal/GU negative Renal ROS  negative genitourinary   Musculoskeletal  (+) Arthritis , Osteoarthritis,    Abdominal (+) + obese,   Peds  Hematology   Anesthesia Other Findings   Reproductive/Obstetrics                           Anesthesia Physical Anesthesia Plan  ASA: III  Anesthesia Plan: General   Post-op Pain Management:    Induction: Intravenous, Rapid sequence and Cricoid pressure planned  PONV Risk Score and Plan: 4 or greater and Ondansetron, Dexamethasone, Midazolam, Scopolamine patch - Pre-op and Propofol infusion  Airway Management Planned: Oral ETT  Additional Equipment:   Intra-op Plan:   Post-operative Plan: Extubation in OR  Informed Consent: I have reviewed the patients History and Physical, chart, labs and discussed the procedure including the risks, benefits and alternatives for the proposed anesthesia with the patient or authorized representative who has indicated his/her understanding and acceptance.   Dental advisory given  Plan Discussed with: CRNA, Surgeon and  Anesthesiologist  Anesthesia Plan Comments:         Anesthesia Quick Evaluation

## 2016-12-17 NOTE — Anesthesia Procedure Notes (Signed)
Procedure Name: Intubation Date/Time: 12/17/2016 10:54 AM Performed by: Wanita Chamberlain Pre-anesthesia Checklist: Patient identified, Emergency Drugs available, Suction available, Patient being monitored and Timeout performed Patient Re-evaluated:Patient Re-evaluated prior to induction Oxygen Delivery Method: Circle system utilized Preoxygenation: Pre-oxygenation with 100% oxygen Induction Type: IV induction, Rapid sequence and Cricoid Pressure applied Grade View: Grade I Tube type: Oral Laser Tube: Cuffed inflated with minimal occlusive pressure - saline Tube size: 7.0 mm Number of attempts: 1 Airway Equipment and Method: Stylet Placement Confirmation: ETT inserted through vocal cords under direct vision Secured at: 21 cm Tube secured with: Tape Dental Injury: Teeth and Oropharynx as per pre-operative assessment

## 2016-12-17 NOTE — H&P (Signed)
Vicki Shea is an 56 y.o. female.   Chief Complaint: abdominal pain HPI: 56 yo female with multiple months of intermittent epigastric pain, nausea and vomiting. She has made dietary changes without improvement and had her last bout 2 days ago with vomiting.  Past Medical History:  Diagnosis Date  . Biliary colic   . Cholelithiasis   . Constipation   . Family history of adverse reaction to anesthesia    mother-- hard to wake  . GERD (gastroesophageal reflux disease)   . History of ectopic pregnancy 1984   s/p  right salpingectomy  . History of ovarian cyst 1980s   s/p RSO  . Lower extremity edema   . OA (osteoarthritis)    right knee  . Thyroid goiter    per pt hx benign bx on the right--- no issues per pt  . Wears glasses     Past Surgical History:  Procedure Laterality Date  . COLONOSCOPY  10-26-2014  dr Earlean Shawl  . ECTOPIC PREGNANCY SURGERY  1990s   explor. lap w/ right salpingectomy  . KNEE ARTHROSCOPY Right 2010  . RIGHT OOPHORECTOMY  1980s   via laparotomy for large ovarian cyst    Family History  Problem Relation Age of Onset  . Kidney disease Mother        dialysis  . Hypertension Mother   . Atrial fibrillation Mother   . Hypertension Brother   . Stroke Brother   . Hypertension Brother    Social History:  reports that she quit smoking about 28 years ago. Her smoking use included Cigarettes. She quit after 5.00 years of use. She has never used smokeless tobacco. She reports that she drinks alcohol. She reports that she does not use drugs.  Allergies:  Allergies  Allergen Reactions  . Cabbage Swelling    Red / purple cabbage- Lip swelling  . Voltaren [Diclofenac Sodium] Swelling    Medications Prior to Admission  Medication Sig Dispense Refill  . Cholecalciferol (VITAMIN D3) 5000 units CAPS Take 1 capsule by mouth every morning.    Marland Kitchen HYDROcodone-acetaminophen (NORCO/VICODIN) 5-325 MG tablet Take 1 tablet by mouth every 6 (six) hours as needed for  moderate pain. Will need a new rx    . methylcellulose (CITRUCEL) oral powder Take by mouth daily.    Marland Kitchen omeprazole (PRILOSEC) 40 MG capsule TAKE 1 CAPSULE(40 MG) BY MOUTH DAILY (Patient taking differently: TAKE 1 CAPSULE(40 MG) BY MOUTH DAILY-- takes in am) 90 capsule 0  . ranitidine (ZANTAC) 300 MG tablet TAKE 1 TABLET BY MOUTH AT BEDTIME 90 tablet 0  . sucralfate (CARAFATE) 1 g tablet Take 1 tablet (1 g total) by mouth 4 (four) times daily. (Patient taking differently: Take 1 g by mouth 4 (four) times daily as needed. ) 360 tablet 0  . vitamin B-12 (CYANOCOBALAMIN) 500 MCG tablet Take 500 mcg by mouth daily.    . vitamin C (ASCORBIC ACID) 500 MG tablet Take 500 mg by mouth every morning.     . furosemide (LASIX) 20 MG tablet Take 1 tablet (20 mg total) by mouth daily as needed for edema. 30 tablet 3    No results found for this or any previous visit (from the past 48 hour(s)). No results found.  Review of Systems  Constitutional: Negative for chills and fever.  HENT: Negative for hearing loss.   Eyes: Negative for blurred vision and double vision.  Respiratory: Negative for cough and hemoptysis.   Cardiovascular: Negative for chest pain and palpitations.  Gastrointestinal: Positive for abdominal pain, nausea and vomiting.  Genitourinary: Negative for dysuria and urgency.  Musculoskeletal: Negative for myalgias and neck pain.  Skin: Negative for itching and rash.  Neurological: Negative for dizziness, tingling and headaches.  Endo/Heme/Allergies: Does not bruise/bleed easily.  Psychiatric/Behavioral: Negative for depression and suicidal ideas.    Blood pressure 132/83, pulse 73, temperature 98.3 F (36.8 C), temperature source Oral, resp. rate 18, height 5' 4.25" (1.632 m), weight 108.6 kg (239 lb 8 oz), SpO2 100 %. Physical Exam  Vitals reviewed. Constitutional: She is oriented to person, place, and time. She appears well-developed and well-nourished.  HENT:  Head: Normocephalic  and atraumatic.  Eyes: Pupils are equal, round, and reactive to light. Conjunctivae and EOM are normal.  Neck: Normal range of motion. Neck supple.  Cardiovascular: Normal rate and regular rhythm.   Respiratory: Effort normal and breath sounds normal.  GI: Soft. Bowel sounds are normal. She exhibits no distension. There is tenderness in the right upper quadrant.  Musculoskeletal: Normal range of motion.  Neurological: She is alert and oriented to person, place, and time.  Skin: Skin is warm and dry.  Psychiatric: She has a normal mood and affect. Her behavior is normal.     Assessment/Plan 56 yo female with chronic calculous cholecysitis -lap chole -planned outpatient discharge -eras protocol  Mickeal Skinner, MD 12/17/2016, 9:53 AM

## 2016-12-17 NOTE — Discharge Instructions (Signed)

## 2016-12-18 ENCOUNTER — Encounter (HOSPITAL_BASED_OUTPATIENT_CLINIC_OR_DEPARTMENT_OTHER): Payer: Self-pay | Admitting: General Surgery

## 2016-12-29 ENCOUNTER — Encounter: Payer: Self-pay | Admitting: Internal Medicine

## 2017-02-28 ENCOUNTER — Other Ambulatory Visit: Payer: Self-pay | Admitting: Physician Assistant

## 2017-05-19 ENCOUNTER — Encounter: Payer: Self-pay | Admitting: Adult Health

## 2017-05-19 ENCOUNTER — Ambulatory Visit: Payer: BLUE CROSS/BLUE SHIELD | Admitting: Adult Health

## 2017-05-19 VITALS — BP 132/82 | HR 87 | Temp 97.9°F | Ht 64.0 in | Wt 243.0 lb

## 2017-05-19 DIAGNOSIS — J0100 Acute maxillary sinusitis, unspecified: Secondary | ICD-10-CM | POA: Diagnosis not present

## 2017-05-19 DIAGNOSIS — B379 Candidiasis, unspecified: Secondary | ICD-10-CM

## 2017-05-19 DIAGNOSIS — R062 Wheezing: Secondary | ICD-10-CM | POA: Diagnosis not present

## 2017-05-19 MED ORDER — PREDNISONE 20 MG PO TABS: ORAL_TABLET | ORAL | 0 refills | Status: DC

## 2017-05-19 MED ORDER — ALBUTEROL SULFATE HFA 108 (90 BASE) MCG/ACT IN AERS: 2.0000 | INHALATION_SPRAY | RESPIRATORY_TRACT | 0 refills | Status: DC | PRN

## 2017-05-19 MED ORDER — FLUCONAZOLE 150 MG PO TABS: 150.0000 mg | ORAL_TABLET | Freq: Once | ORAL | 3 refills | Status: AC

## 2017-05-19 MED ORDER — AZITHROMYCIN 250 MG PO TABS: ORAL_TABLET | ORAL | 1 refills | Status: AC

## 2017-05-19 NOTE — Progress Notes (Signed)
Assessment and Plan:  Cyana was seen today for cough, generalized body aches and sore throat.  Diagnoses and all orders for this visit:  Acute non-recurrent maxillary sinusitis Likely viral illness progressing into bacterial sinusitis-  Suggested symptomatic OTC remedies. Nasal saline spray for congestion. Nasal steroids, allergy pill, oral steroids - can stop as soon as symptoms begin clearing Follow up as needed - call with any new concerning symptoms or present to ER for severe symptoms -     azithromycin (ZITHROMAX) 250 MG tablet; Take 2 tablets (500 mg) on  Day 1,  followed by 1 tablet (250 mg) once daily on Days 2 through 5. -     predniSONE (DELTASONE) 20 MG tablet; 2 tablets daily for 3 days, 1 tablet daily for 4 days.  Yeast infection -     fluconazole (DIFLUCAN) 150 MG tablet; Take 1 tablet (150 mg total) by mouth once for 1 dose.  Wheezing -     albuterol (VENTOLIN HFA) 108 (90 Base) MCG/ACT inhaler; Inhale 2 puffs into the lungs every 4 (four) hours as needed for wheezing or shortness of breath.   Further disposition pending results of labs. Discussed med's effects and SE's.   Over 15 minutes of exam, counseling, chart review, and critical decision making was performed.   Future Appointments  Date Time Provider [place]  06/01/2017  8:45 AM [name], PA-C [facility] None  12/04/2017  9:30 AM [name], PA-C [facility] None    ------------------------------------------------------------------------------------------------------------------   HPI BP 132/82   Pulse 87   Temp 97.9 F (36.6 C)   Ht 5\' 4"  (1.626 m)   Wt 243 lb (110.2 kg)   SpO2 98%   BMI 41.71 kg/m   57 y.o.female presents for sore throat x 3 days then which progressed into body aches, cough, headaches, fatigue/malaise now going on for 6 days, now having more facial congestion and ear pressure. She endorses sense of fever on day 3 but has not checked temp at home. She  endorses new mild wheezing that began yesterday - she has noted this when lying down in bed. She reports symptoms are not improving and is concerned about lack of progress.   She has tried day/night cold and flu medications, has taken tylenol for the headaches.    No significant respiratory or cardiac hx; she did have the flu vaccine this year. No known sick contacts.   Past Medical History:  Diagnosis Date  . Biliary colic   . Cholelithiasis   . Constipation   . Family history of adverse reaction to anesthesia    mother-- hard to wake  . GERD (gastroesophageal reflux disease)   . History of ectopic pregnancy 1984   s/p  right salpingectomy  . History of ovarian cyst 1980s   s/p RSO  . Lower extremity edema   . OA (osteoarthritis)    right knee  . Thyroid goiter    per pt hx benign bx on the right--- no issues per pt  . Wears glasses      Allergies  Allergen Reactions  . Cabbage Swelling    Red / purple cabbage- Lip swelling  . Voltaren [Diclofenac Sodium] Swelling    Current Outpatient Medications on File Prior to Visit  Medication Sig  . Cholecalciferol (VITAMIN D3) 5000 units CAPS Take 1 capsule by mouth every morning.  . furosemide (LASIX) 20 MG tablet Take 1 tablet (20 mg total) by mouth daily as needed for edema.  . methylcellulose (CITRUCEL) oral  powder Take by mouth daily.  . vitamin B-12 (CYANOCOBALAMIN) 500 MCG tablet Take 500 mcg by mouth daily.  . vitamin C (ASCORBIC ACID) 500 MG tablet Take 500 mg by mouth every morning.   Marland Kitchen HYDROcodone-acetaminophen (NORCO/VICODIN) 5-325 MG tablet Take 1 tablet by mouth every 6 (six) hours as needed for moderate pain. Will need a new rx  . HYDROcodone-acetaminophen (NORCO/VICODIN) 5-325 MG tablet Take 1-2 tablets by mouth every 6 (six) hours as needed for moderate pain. (Patient not taking: Reported on 05/19/2017)  . ibuprofen (ADVIL,MOTRIN) 800 MG tablet Take 1 tablet (800 mg total) by mouth every 8 (eight) hours as needed.  (Patient not taking: Reported on 05/19/2017)  . omeprazole (PRILOSEC) 40 MG capsule TAKE 1 CAPSULE(40 MG) BY MOUTH DAILY (Patient not taking: Reported on 05/19/2017)  . ranitidine (ZANTAC) 300 MG tablet TAKE 1 TABLET BY MOUTH AT BEDTIME (Patient not taking: Reported on 05/19/2017)  . sucralfate (CARAFATE) 1 g tablet Take 1 tablet (1 g total) by mouth 4 (four) times daily. (Patient not taking: Reported on 05/19/2017)   No current facility-administered medications on file prior to visit.     ROS: Review of Systems  Constitutional: Positive for chills and malaise/fatigue. Negative for diaphoresis and fever.  HENT: Positive for congestion and sore throat. Negative for ear discharge, ear pain, hearing loss, sinus pain and tinnitus.   Eyes: Negative for blurred vision, pain, discharge and redness.  Respiratory: Positive for cough and wheezing (Mild, at night). Negative for hemoptysis, sputum production, shortness of breath and stridor.   Cardiovascular: Negative for chest pain, palpitations and orthopnea.  Gastrointestinal: Negative for abdominal pain, diarrhea, nausea and vomiting.  Genitourinary: Negative.   Musculoskeletal: Negative for joint pain and myalgias.  Skin: Negative for rash.  Neurological: Positive for headaches (Mild, controlled by tylenol). Negative for dizziness, sensory change and weakness.  Endo/Heme/Allergies: Negative for environmental allergies.  Psychiatric/Behavioral: Negative.   All other systems reviewed and are negative.   Physical Exam:  BP 132/82   Pulse 87   Temp 97.9 F (36.6 C)   Ht 5\' 4"  (1.626 m)   Wt 243 lb (110.2 kg)   SpO2 98%   BMI 41.71 kg/m   General Appearance: Well nourished, in no apparent distress. Eyes: PERRLA, EOMs, conjunctiva no swelling or erythema Sinuses: No Frontal tenderness, + R sided maxillary tenderness ENT/Mouth: Ext aud canals clear, TMs without erythema, bulging. Mild fluid effusion to R - No erythema, swelling, or exudate on post  pharynx.  Tonsils not swollen or erythematous. Hearing normal.  Neck: Supple, thyroid normal.  Respiratory: Respiratory effort normal, frequent reactive cough to attempts at deep breaths, BS equal bilaterally without rales, rhonchi, wheezing or stridor.  Cardio: RRR with no MRGs. Brisk peripheral pulses without edema.  Abdomen: Soft, + BS.  Non tender, no guarding, rebound, hernias, masses. Lymphatics: Non tender without lymphadenopathy.  Musculoskeletal: 5/5 strength, normal gait.  Skin: Warm, dry without rashes, lesions, ecchymosis.  Neuro: Cranial nerves intact. Normal muscle tone, no cerebellar symptoms.  Psych: Awake and oriented X 3, normal affect, Insight and Judgment appropriate.     Izora Ribas, NP 2:09 PM [facility]

## 2017-05-19 NOTE — Patient Instructions (Signed)

## 2017-05-27 ENCOUNTER — Other Ambulatory Visit: Payer: Self-pay | Admitting: Adult Health

## 2017-05-27 ENCOUNTER — Telehealth: Payer: Self-pay

## 2017-05-27 DIAGNOSIS — J0100 Acute maxillary sinusitis, unspecified: Secondary | ICD-10-CM

## 2017-05-27 MED ORDER — LEVOFLOXACIN 750 MG PO TABS: 750.0000 mg | ORAL_TABLET | Freq: Every day | ORAL | 0 refills | Status: AC

## 2017-05-27 MED ORDER — PREDNISONE 20 MG PO TABS: ORAL_TABLET | ORAL | 0 refills | Status: DC

## 2017-05-27 NOTE — Telephone Encounter (Signed)
Patient notified

## 2017-05-27 NOTE — Telephone Encounter (Signed)
Patient was seen on 05/19/17 for the flu. Was feeling better but yesterday she started feeling bad again, having chills. She finished her antibiotics but has body aches, headaches, coughing & sore throat. Would like some advice on what to do.

## 2017-06-01 ENCOUNTER — Ambulatory Visit: Payer: BLUE CROSS/BLUE SHIELD | Admitting: Physician Assistant

## 2017-06-01 ENCOUNTER — Encounter: Payer: Self-pay | Admitting: Physician Assistant

## 2017-06-01 VITALS — BP 128/80 | HR 86 | Temp 97.8°F | Resp 18 | Ht 64.0 in | Wt 242.2 lb

## 2017-06-01 DIAGNOSIS — R7309 Other abnormal glucose: Secondary | ICD-10-CM | POA: Diagnosis not present

## 2017-06-01 DIAGNOSIS — R062 Wheezing: Secondary | ICD-10-CM | POA: Diagnosis not present

## 2017-06-01 DIAGNOSIS — Z79899 Other long term (current) drug therapy: Secondary | ICD-10-CM

## 2017-06-01 DIAGNOSIS — E559 Vitamin D deficiency, unspecified: Secondary | ICD-10-CM

## 2017-06-01 DIAGNOSIS — E785 Hyperlipidemia, unspecified: Secondary | ICD-10-CM | POA: Diagnosis not present

## 2017-06-01 MED ORDER — PHENTERMINE HCL 37.5 MG PO TABS: 37.5000 mg | ORAL_TABLET | Freq: Every day | ORAL | 2 refills | Status: DC

## 2017-06-01 MED ORDER — IPRATROPIUM-ALBUTEROL 0.5-2.5 (3) MG/3ML IN SOLN
3.0000 mL | Freq: Once | RESPIRATORY_TRACT | Status: AC
  Administered 2017-06-01: 3 mL via RESPIRATORY_TRACT

## 2017-06-01 NOTE — Patient Instructions (Addendum)
Intermittent fasting is more about strategy than starvation. It's meant to reset your body in different ways, hopefully with fitness and nutrition changes as a result.  Like any big switchover, though, results may vary when it comes down to the individual level. What works for your friends may not work for you, or vice versa. That's why it's helpful to play around with variations on intermittent fasting and healthy habits and find what works best for you.  WHAT IS INTERMITTENT FASTING AND WHY DO IT?  Intermittent fasting doesn't involve specific foods, but rather, a strict schedule regarding when you eat. Also called "time-restricted eating," the tactic has been praised for its contribution to weight loss, improved body composition, and decreased cravings. Preliminary research also suggests it may be beneficial for glucose tolerance, hormone regulation, better muscle mass and lower body fat.  Part of its appeal is the simplicity of the effort. Unlike some other trends, there's no calculations to intermittent fasting.  You simply eat within a certain block of time, usually a window of 8-10 hours. In the other big block of time - about 14-16 hours, including when you're asleep - you don't eat anything, not even snacks. You can drink water, coffee, tea or any other beverage that doesn't have calories.  For example, if you like having a late dinner, you might skip breakfast and have your first meal at noon and your last meal of the day at 8 p.m., and then not eat until noon again the next day.  IDEAS FOR GETTING STARTED  If you're new to the strategy, it may be helpful to eat within the typical circadian rhythm and keep eating within daylight hours. This can be especially beneficial if you're looking at intermittent fasting for weight-loss goals.  So first try only eating between 12pm to 8pm.  Outside of this time you may have water, black coffee, and hot tea. You may not eat it drink anything that  has carbs, sugars, OR artificial sugars like diet soda.   Like any major eating and fitness shift, it can take time to find the perfect fit, so don't be afraid to experiment with different options - including ditching intermittent fasting altogether if it's simply not for you. But if it is, you may be surprised by some of the benefits that come along with the strategy.  Are you an emotional eater? Do you eat more when you're feeling stressed? Do you eat when you're not hungry or when you're full? Do you eat to feel better (to calm and soothe yourself when you're sad, mad, bored, anxious, etc.)? Do you reward yourself with food? Do you regularly eat until you've stuffed yourself? Does food make you feel safe? Do you feel like food is a friend? Do you feel powerless or out of control around food?  If you answered yes to some of these questions than it is likely that you are an emotional eater. This is normally a learned behavior and can take time to first recognize the signs and second BREAK THE HABIT. But here is more information and tips to help.   The difference between emotional hunger and physical hunger Emotional hunger can be powerful, so it's easy to mistake it for physical hunger. But there are clues you can look for to help you tell physical and emotional hunger apart.  Emotional hunger comes on suddenly. It hits you in an instant and feels overwhelming and urgent. Physical hunger, on the other hand, comes on more gradually.  The urge to eat doesn't feel as dire or demand instant satisfaction (unless you haven't eaten for a very long time).  Emotional hunger craves specific comfort foods. When you're physically hungry, almost anything sounds good-including healthy stuff like vegetables. But emotional hunger craves junk food or sugary snacks that provide an instant rush. You feel like you need cheesecake or pizza, and nothing else will do.  Emotional hunger often leads to mindless eating.  Before you know it, you've eaten a whole bag of chips or an entire pint of ice cream without really paying attention or fully enjoying it. When you're eating in response to physical hunger, you're typically more aware of what you're doing.  Emotional hunger isn't satisfied once you're full. You keep wanting more and more, often eating until you're uncomfortably stuffed. Physical hunger, on the other hand, doesn't need to be stuffed. You feel satisfied when your stomach is full.  Emotional hunger isn't located in the stomach. Rather than a growling belly or a pang in your stomach, you feel your hunger as a craving you can't get out of your head. You're focused on specific textures, tastes, and smells.  Emotional hunger often leads to regret, guilt, or shame. When you eat to satisfy physical hunger, you're unlikely to feel guilty or ashamed because you're simply giving your body what it needs. If you feel guilty after you eat, it's likely because you know deep down that you're not eating for nutritional reasons.  Identify your emotional eating triggers What situations, places, or feelings make you reach for the comfort of food? Most emotional eating is linked to unpleasant feelings, but it can also be triggered by positive emotions, such as rewarding yourself for achieving a goal or celebrating a holiday or happy event. Common causes of emotional eating include:  Stuffing emotions - Eating can be a way to temporarily silence or "stuff down" uncomfortable emotions, including anger, fear, sadness, anxiety, loneliness, resentment, and shame. While you're numbing yourself with food, you can avoid the difficult emotions you'd rather not feel.  Boredom or feelings of emptiness - Do you ever eat simply to give yourself something to do, to relieve boredom, or as a way to fill a void in your life? You feel unfulfilled and empty, and food is a way to occupy your mouth and your time. In the moment, it fills you up  and distracts you from underlying feelings of purposelessness and dissatisfaction with your life.  Childhood habits - Think back to your childhood memories of food. Did your parents reward good behavior with ice cream, take you out for pizza when you got a good report card, or serve you sweets when you were feeling sad? These habits can often carry over into adulthood. Or your eating may be driven by nostalgia-for cherished memories of grilling burgers in the backyard with your dad or baking and eating cookies with your mom.  Social influences - Getting together with other people for a meal is a great way to relieve stress, but it can also lead to overeating. It's easy to overindulge simply because the food is there or because everyone else is eating. You may also overeat in social situations out of nervousness. Or perhaps your family or circle of friends encourages you to overeat, and it's easier to go along with the group.  Stress - Ever notice how stress makes you hungry? It's not just in your mind. When stress is chronic, as it so often is in our chaotic, fast-paced  world, your body produces high levels of the stress hormone, cortisol. Cortisol triggers cravings for salty, sweet, and fried foods-foods that give you a burst of energy and pleasure. The more uncontrolled stress in your life, the more likely you are to turn to food for emotional relief.  Find other ways to feed your feelings If you don't know how to manage your emotions in a way that doesn't involve food, you won't be able to control your eating habits for very long. Diets so often fail because they offer logical nutritional advice which only works if you have conscious control over your eating habits. It doesn't work when emotions hijack the process, demanding an immediate payoff with food.  In order to stop emotional eating, you have to find other ways to fulfill yourself emotionally. It's not enough to understand the cycle of emotional  eating or even to understand your triggers, although that's a huge first step. You need alternatives to food that you can turn to for emotional fulfillment.  Alternatives to emotional eating If you're depressed or lonely, call someone who always makes you feel better, play with your dog or cat, or look at a favorite photo or cherished memento.  If you're anxious, expend your nervous energy by dancing to your favorite song, squeezing a stress ball, or taking a brisk walk.  If you're exhausted, treat yourself with a hot cup of tea, take a bath, light some scented candles, or wrap yourself in a warm blanket.  If you're bored, read a good book, watch a comedy show, explore the outdoors, or turn to an activity you enjoy (woodworking, playing the guitar, shooting hoops, scrapbooking, etc.).  What is mindful eating? Mindful eating is a practice that develops your awareness of eating habits and allows you to pause between your triggers and your actions. Most emotional eaters feel powerless over their food cravings. When the urge to eat hits, you feel an almost unbearable tension that demands to be fed, right now. Because you've tried to resist in the past and failed, you believe that your willpower just isn't up to snuff. But the truth is that you have more power over your cravings than you think.  Take 5 before you give in to a craving Emotional eating tends to be automatic and virtually mindless. Before you even realize what you're doing, you've reached for a tub of ice cream and polished off half of it. But if you can take a moment to pause and reflect when you're hit with a craving, you give yourself the opportunity to make a different decision.  Can you put off eating for five minutes? Or just start with one minute. Don't tell yourself you can't give in to the craving; remember, the forbidden is extremely tempting. Just tell yourself to wait.  While you're waiting, check in with yourself. How are you  feeling? What's going on emotionally? Even if you end up eating, you'll have a better understanding of why you did it. This can help you set yourself up for a different response next time.  How to practice mindful eating Eating while you're also doing other things-such as watching TV, driving, or playing with your phone-can prevent you from fully enjoying your food. Since your mind is elsewhere, you may not feel satisfied or continue eating even though you're no longer hungry. Eating more mindfully can help focus your mind on your food and the pleasure of a meal and curb overeating.   Eat your meals in a  calm place with no distractions, aside from any dining companions.  Try eating with your non-dominant hand or using chopsticks instead of a knife and fork. Eating in such a non-familiar way can slow down how fast you eat and ensure your mind stays focused on your food.  Allow yourself enough time not to have to rush your meal. Set a timer for 20 minutes and pace yourself so you spend at least that much time eating.  Take small bites and chew them well, taking time to notice the different flavors and textures of each mouthful.  Put your utensils down between bites. Take time to consider how you feel-hungry, satiated-before picking up your utensils again.  Try to stop eating before you are full.It takes time for the signal to reach your brain that you've had enough. Don't feel obligated to always clean your plate.  When you've finished your food, take a few moments to assess if you're really still hungry before opting for an extra serving or dessert.  Learn to accept your feelings-even the bad ones  While it may seem that the core problem is that you're powerless over food, emotional eating actually stems from feeling powerless over your emotions. You don't feel capable of dealing with your feelings head on, so you avoid them with food.  Recommended reading  Mini Habits for weight loss-  really good book get this   Healthy Eating: A guide to the new nutrition - [employer] Report  10 Tips for Mindful Eating - How mindfulness can help you fully enjoy a meal and the experience of eating-with moderation and restraint. ([place])  Weight Loss: Gain Control of Emotional Eating - Tips to regain control of your eating habits. [facility])  Why Stress Causes People to Overeat -Tips on controlling stress eating. ([place])  Mindful Eating Meditations -Free online mindfulness meditations. ([facility])     Veggies are great because you can eat a ton! They are low in calories, great to fill you up, and have a ton of vitamins, minerals, and protein.      Phentermine  While taking the medication we may ask that you come into the office once a month for the first month for a blood pressure check and EKG.   Also please bring in a food log for that visit to review. It is helpful if you bring in a food diary or use an app on your phone such as myfitnesspal to record your calorie intake, especially in the beginning. BRING FOR YOUR FIRST VISIT.  After that first initial visit, we will want to see you once every 2-3 months to monitor your weight, blood pressure, and heart rate.   In addition we can help answer your questions about diet, exercise, and help you every step of the way with your weight loss journey.  You can start out on 1/3 to 1/2 a pill in the morning and if you are tolerating it well you can increase to one pill daily. I also have some patients that take 1/3 or 1/2 at lunch to help prevent night time eating.  This medication is cheapest CASH pay at [place] is 14-17 dollars and you do NOT need a membership to get meds from there.   It causes dry mouth and constipation in almost every patient, so try to get 80-100 oz of water a day and increase fiber such as veggies. You can  add on a stool softener if you would like.   It can give you energy however it can also cause some people to be shaky, anxious or have palpitations. Stop this medication if that happens and contact the office.   If this medication does not work for you there are several medications that we can try to help rewire your brain in addition to making healthier habits.   What is this medicine? PHENTERMINE (FEN ter meen) decreases your appetite. This medicine is intended to be used in addition to a healthy reduced calorie diet and exercise. The best results are achieved this way. This medicine is only indicated for short-term use. Eventually your weight loss may level out and the medication will no longer be needed.   How should I use this medicine? Take this medicine by mouth. Follow the directions on the prescription label. The tablets should stay in the bottle until immediately before you take your dose. Take your doses at regular intervals. Do not take your medicine more often than directed.  Overdosage: If you think you have taken too much of this medicine contact a poison control center or emergency room at once. NOTE: This medicine is only for you. Do not share this medicine with others.  What if I miss a dose? If you miss a dose, take it as soon as you can. If it is almost time for your next dose, take only that dose. Do not take double or extra doses. Do not increase or in any way change your dose without consulting your doctor.  What should I watch for while using this medicine? Notify your physician immediately if you become short of breath while doing your normal activities. Do not take this medicine within 6 hours of bedtime. It can keep you from getting to sleep. Avoid drinks that contain caffeine and try to stick to a regular bedtime every night. Do not stand or sit up quickly, especially if you are an older patient. This reduces the risk of dizzy or fainting spells. Avoid alcoholic  drinks.  What side effects may I notice from receiving this medicine? Side effects that you should report to your doctor or health care professional as soon as possible: -chest pain, palpitations -depression or severe changes in mood -increased blood pressure -irritability -nervousness or restlessness -severe dizziness -shortness of breath -problems urinating -unusual swelling of the legs -vomiting  Side effects that usually do not require medical attention (report to your doctor or health care professional if they continue or are bothersome): -blurred vision or other eye problems -changes in sexual ability or desire -constipation or diarrhea -difficulty sleeping -dry mouth or unpleasant taste -headache -nausea This list may not describe all possible side effects. Call your doctor for medical advice about side effects. You may report side effects to FDA at 1-800-FDA-1088.    HOW TO TREAT VIRAL COUGH AND COLD SYMPTOMS:  -Symptoms usually last at least 1 week with the worst symptoms being around day 4.  - colds usually start with a sore throat and end with a cough, and the cough can take 2 weeks to get better.  -No antibiotics are needed for colds, flu, sore throats, cough, bronchitis UNLESS symptoms are longer than 7 days OR if you are getting better then get drastically worse.  -There are a lot of combination medications (Dayquil, Nyquil, Vicks 44, tyelnol cold and sinus, ETC). Please look at the ingredients on the back so that you are treating the correct symptoms and not  doubling up on medications/ingredients.    Medicines you can use  Nasal congestion  Little Remedies saline spray (aerosol/mist)- can try this, it is in the kids section - pseudoephedrine (Sudafed)- behind the counter, do not use if you have high blood pressure, medicine that have -D in them.  - phenylephrine (Sudafed PE) -Dextormethorphan + chlorpheniramine (Coridcidin HBP)- okay if you have high blood  pressure -Oxymetazoline (Afrin) nasal spray- LIMIT to 3 days -Saline nasal spray -Neti pot (used distilled or bottled water)  Ear pain/congestion  -pseudoephedrine (sudafed) - Nasonex/flonase nasal spray  Fever  -Acetaminophen (Tyelnol) -Ibuprofen (Advil, motrin, aleve)  Sore Throat  -Acetaminophen (Tyelnol) -Ibuprofen (Advil, motrin, aleve) -Drink a lot of water -Gargle with salt water - Rest your voice (don't talk) -Throat sprays -Cough drops  Body Aches  -Acetaminophen (Tyelnol) -Ibuprofen (Advil, motrin, aleve)  Headache  -Acetaminophen (Tyelnol) -Ibuprofen (Advil, motrin, aleve) - Exedrin, Exedrin Migraine  Allergy symptoms (cough, sneeze, runny nose, itchy eyes) -Claritin or loratadine cheapest but likely the weakest  -Zyrtec or certizine at night because it can make you sleepy -The strongest is allegra or fexafinadine  Cheapest at [facility], [employer], [facility]  Cough  -Dextromethorphan (Delsym)- medicine that has DM in it -Guafenesin (Mucinex/Robitussin) - cough drops - drink lots of water  Chest Congestion  -Guafenesin (Mucinex/Robitussin)  Red Itchy Eyes  - Naphcon-A  Upset Stomach  - Bland diet (nothing spicy, greasy, fried, and high acid foods like tomatoes, oranges, berries) -OKAY- cereal, bread, soup, crackers, rice -Eat smaller more frequent meals -reduce caffeine, no alcohol -Loperamide (Imodium-AD) if diarrhea -Prevacid for heart burn  General health when sick  -Hydration -wash your hands frequently -keep surfaces clean -change pillow cases and sheets often -Get fresh air but do not exercise strenuously -Vitamin D, double up on it - Vitamin C -Zinc

## 2017-06-01 NOTE — Progress Notes (Signed)
Complete Physical  Assessment and Plan:  Abnormal glucose Discussed general issues about diabetes pathophysiology and management., Educational material distributed., Suggested low cholesterol diet., Encouraged aerobic exercise., Discussed foot care., Reminded to get yearly retinal exam. -     Hemoglobin A1c  Medication management -     CBC with Differential/Platelet -     BASIC METABOLIC PANEL WITH GFR -     Hepatic function panel -     Magnesium  Vitamin D deficiency -     VITAMIN D 25 Hydroxy (Vit-D Deficiency, Fractures)  Morbid obesity - - follow up 3 months for progress monitoring - long discussion about weight loss, diet, and exercise, will start the patient on phentermine- hand out given and AE's discussed, will do close follow up 4-6 weeks Given information about intermittent fasting, tips on eating out, emotional eating  Hyperlipidemia -continue medications, check lipids, decrease fatty foods, increase activity.   Cough douneb in the office, continue meds  Discussed med's effects and SE's. Screening labs and tests as requested with regular follow-up as recommended. Over 40 minutes of exam, counseling, chart review, and complex, high level critical decision making was performed this visit.  Future Appointments  Date Time Provider [place]  12/04/2017  9:30 AM [name], PA-C [facility] None    HPI  57 y.o. MAAF female  presents for a complete physical and follow up for does not have a problem list on file.Marland Kitchen  Her blood pressure has not been checked at home, today their BP is BP: 128/80, states did not sleep well with stomach hurting.  She does not workout. She denies chest pain, shortness of breath, dizziness.  She is not  on cholesterol medication and denies myalgias. Her cholesterol is not at goal. The cholesterol last visit was:   Lab Results  Component Value Date   CHOL 210 (H) 11/28/2016   HDL 65 11/28/2016   LDLCALC 107 (H) 08/28/2014   TRIG 98  11/28/2016   CHOLHDL 3.2 11/28/2016   She has been working on diet and exercise for prediabetes, and denies paresthesia of the feet, polydipsia, polyuria and visual disturbances. Last A1C in the office was:  Lab Results  Component Value Date   HGBA1C CANCELED 11/28/2016   Lab Results  Component Value Date   GFRAA >60 11/29/2016   Patient is on Vitamin D supplement.   Lab Results  Component Value Date   VD25OH 20 (L) 11/28/2016     BMI is Body mass index is 41.57 kg/m., she is working on diet and exercise. She eats 2 meals a day, she is bored/emotional eating while getting her masters, she will eat out almost every day at work. She does not work out.  Wt Readings from Last 3 Encounters:  06/01/17 242 lb 3.2 oz (109.9 kg)  05/19/17 243 lb (110.2 kg)  12/17/16 239 lb 8 oz (108.6 kg)    Current Medications:  Current Outpatient Medications on File Prior to Visit  Medication Sig Dispense Refill  . albuterol (VENTOLIN HFA) 108 (90 Base) MCG/ACT inhaler Inhale 2 puffs into the lungs every 4 (four) hours as needed for wheezing or shortness of breath. 1 Inhaler 0  . Cholecalciferol (VITAMIN D3) 5000 units CAPS Take 1 capsule by mouth every morning.    . furosemide (LASIX) 20 MG tablet Take 1 tablet (20 mg total) by mouth daily as needed for edema. 30 tablet 3  . HYDROcodone-acetaminophen (NORCO/VICODIN) 5-325 MG tablet Take 1 tablet by mouth every 6 (six) hours  as needed for moderate pain. Will need a new rx    . ibuprofen (ADVIL,MOTRIN) 800 MG tablet Take 1 tablet (800 mg total) by mouth every 8 (eight) hours as needed. 30 tablet 0  . levofloxacin (LEVAQUIN) 750 MG tablet Take 1 tablet (750 mg total) by mouth daily for 5 days. 5 tablet 0  . methylcellulose (CITRUCEL) oral powder Take by mouth daily.    Marland Kitchen omeprazole (PRILOSEC) 40 MG capsule TAKE 1 CAPSULE(40 MG) BY MOUTH DAILY 90 capsule 0  . predniSONE (DELTASONE) 20 MG tablet 2 tablets daily for 3 days, 1 tablet daily for 4 days. 10  tablet 0  . ranitidine (ZANTAC) 300 MG tablet TAKE 1 TABLET BY MOUTH AT BEDTIME 90 tablet 1  . sucralfate (CARAFATE) 1 g tablet Take 1 tablet (1 g total) by mouth 4 (four) times daily. 360 tablet 0  . vitamin B-12 (CYANOCOBALAMIN) 500 MCG tablet Take 500 mcg by mouth daily.    . vitamin C (ASCORBIC ACID) 500 MG tablet Take 500 mg by mouth every morning.      No current facility-administered medications on file prior to visit.    Allergies:  Allergies  Allergen Reactions  . Cabbage Swelling    Red / purple cabbage- Lip swelling  . Voltaren [Diclofenac Sodium] Swelling   Medical History:  She does not have a problem list on file.   Surgical History: reviewed and unchanged Family History: reviewed and unchanged Social History: reviewed and unchanged  Review of Systems: Review of Systems  Constitutional: Negative for chills, fever, malaise/fatigue and weight loss.  HENT: Negative for congestion, ear pain and sore throat.   Eyes: Negative.   Respiratory: Negative for cough, shortness of breath and wheezing.   Cardiovascular: Positive for leg swelling. Negative for chest pain and palpitations.  Gastrointestinal: Positive for heartburn. Negative for blood in stool, constipation, diarrhea, melena, nausea and vomiting.  Genitourinary: Negative.   Musculoskeletal: Negative for joint pain.  Skin: Negative.   Neurological: Negative for dizziness, sensory change, loss of consciousness and headaches.  Psychiatric/Behavioral: Negative for depression. The patient is not nervous/anxious and does not have insomnia.     Physical Exam: Estimated body mass index is 41.57 kg/m as calculated from the following:   Height as of this encounter: 5\' 4"  (1.626 m).   Weight as of this encounter: 242 lb 3.2 oz (109.9 kg). BP 128/80   Pulse 86   Temp 97.8 F (36.6 C)   Resp 18   Ht 5\' 4"  (1.626 m)   Wt 242 lb 3.2 oz (109.9 kg)   SpO2 97%   BMI 41.57 kg/m  General Appearance: Well nourished, in no  apparent distress.  Eyes: PERRLA, EOMs, conjunctiva no swelling or erythema, normal fundi and vessels.  Sinuses: No Frontal/maxillary tenderness  ENT/Mouth: Ext aud canals clear, normal light reflex with TMs without erythema, bulging. Good dentition. No erythema, swelling, or exudate on post pharynx. Tonsils not swollen or erythematous. Hearing normal.  Neck: Supple, thyroid normal. No bruits  Respiratory: Respiratory effort normal, BS equal bilaterally without rales, rhonchi, wheezing or stridor.  Cardio: RRR without murmurs, rubs or gallops. Brisk peripheral pulses without edema.  Chest: symmetric, with normal excursions and percussion.  Abdomen: Soft, nontender, no guarding, rebound, hernias, masses, or organomegaly.  Lymphatics: Non tender without lymphadenopathy.  Musculoskeletal: Full ROM all peripheral extremities,5/5 strength, and normal gait.  Skin: Warm, dry without rashes, lesions, ecchymosis. Neuro: Cranial nerves intact, reflexes equal bilaterally. Normal muscle tone, no cerebellar symptoms.  Sensation intact.  Psych: Awake and oriented X 3, normal affect, Insight and Judgment appropriate.    [name] 9:05 AM [facility]

## 2017-06-26 NOTE — Progress Notes (Signed)
57 y.o.female presents for a follow up after being on phentermine for weight loss.  While on the medication they have lost 3 lbs since last visit. They deny palpitations, anxiety, trouble sleeping, elevated BP.   BP Readings from Last 3 Encounters:  06/29/17 122/74  06/01/17 128/80  05/19/17 132/82   An EKG was obtained at today's visit- WNL- no ST changes  BMI is Body mass index is 41.13 kg/m., she is working on diet and exercise. Wt Readings from Last 3 Encounters:  06/29/17 239 lb 9.6 oz (108.7 kg)  06/01/17 242 lb 3.2 oz (109.9 kg)  05/19/17 243 lb (110.2 kg)   She has a rash bilateral arms from a sweater- will send in cream.   Medications: Current Outpatient Medications on File Prior to Visit  Medication Sig Dispense Refill  . Cholecalciferol (VITAMIN D3) 5000 units CAPS Take 1 capsule by mouth every morning.    . furosemide (LASIX) 20 MG tablet Take 1 tablet (20 mg total) by mouth daily as needed for edema. 30 tablet 3  . ibuprofen (ADVIL,MOTRIN) 800 MG tablet Take 1 tablet (800 mg total) by mouth every 8 (eight) hours as needed. 30 tablet 0  . methylcellulose (CITRUCEL) oral powder Take by mouth daily.    Marland Kitchen omeprazole (PRILOSEC) 40 MG capsule TAKE 1 CAPSULE(40 MG) BY MOUTH DAILY 90 capsule 0  . phentermine (ADIPEX-P) 37.5 MG tablet Take 1 tablet (37.5 mg total) by mouth daily before breakfast. 30 tablet 2  . ranitidine (ZANTAC) 300 MG tablet TAKE 1 TABLET BY MOUTH AT BEDTIME 90 tablet 1  . sucralfate (CARAFATE) 1 g tablet Take 1 tablet (1 g total) by mouth 4 (four) times daily. 360 tablet 0  . vitamin B-12 (CYANOCOBALAMIN) 500 MCG tablet Take 500 mcg by mouth daily.    . vitamin C (ASCORBIC ACID) 500 MG tablet Take 500 mg by mouth every morning.     . predniSONE (DELTASONE) 20 MG tablet 2 tablets daily for 3 days, 1 tablet daily for 4 days. (Patient not taking: Reported on 06/29/2017) 10 tablet 0   No current facility-administered medications on file prior to visit.     ROS:  All negative except for above  Physical exam: Vitals:   06/29/17 0842  BP: 122/74  Resp: 16  Temp: (!) 97.3 F (36.3 C)  SpO2: 98%   Physical Exam Normal  Assessment: Obesity with co morbid conditions.  Rash  Plan: General weight loss/lifestyle modification strategies discussed (elicit support from others; identify saboteurs; non-food rewards, etc). Continue food diary Continue restricted calorie diet - make sure eating enough calories Increase veggies Continue daily exercise as well as behavior modification such as walking further away, putting down the fork, having a plan, using stairs, etc.  Medication: phentermine  Follow up in 2 months Future Appointments  Date Time Provider [place]  12/04/2017  9:30 AM [name], PA-C [facility] None

## 2017-06-29 ENCOUNTER — Encounter: Payer: Self-pay | Admitting: Physician Assistant

## 2017-06-29 ENCOUNTER — Ambulatory Visit: Payer: BLUE CROSS/BLUE SHIELD | Admitting: Physician Assistant

## 2017-06-29 VITALS — BP 122/74 | Temp 97.3°F | Resp 16 | Ht 64.0 in | Wt 239.6 lb

## 2017-06-29 DIAGNOSIS — Z79899 Other long term (current) drug therapy: Secondary | ICD-10-CM | POA: Diagnosis not present

## 2017-06-29 DIAGNOSIS — Z6841 Body Mass Index (BMI) 40.0 and over, adult: Secondary | ICD-10-CM | POA: Diagnosis not present

## 2017-06-29 DIAGNOSIS — L258 Unspecified contact dermatitis due to other agents: Secondary | ICD-10-CM

## 2017-06-29 MED ORDER — TRIAMCINOLONE ACETONIDE 0.5 % EX CREA: 1.0000 "application " | TOPICAL_CREAM | Freq: Two times a day (BID) | CUTANEOUS | 2 refills | Status: DC

## 2017-06-29 MED ORDER — PHENTERMINE HCL 37.5 MG PO TABS: 37.5000 mg | ORAL_TABLET | Freq: Every day | ORAL | 1 refills | Status: DC

## 2017-06-29 NOTE — Patient Instructions (Addendum)
If you are doing the intermittent fasting, only take 1/2 of the phentermine when you eat at 12 with food and see how you do  Try to get 1200 calories AT least a day, if you start to work out try closer to 1400 calories  Find out how many calories are in your starbucks drink   Veggies are great because you can eat a ton! They are low in calories, great to fill you up, and have a ton of vitamins, minerals, and protein.      8 Critical Weight-Loss Tips That Aren't Diet and Exercise  1. STARVE THE DISTRACTIONS  All too often when we eat, we're also multitasking: watching TV, answering emails, scrolling through social media. These habits are detrimental to having a strong, clear, healthy relationship with food, and they can hinder our ability to make dietary changes.  In order to truly focus on what you're eating, how much you're eating, why you're eating those specific foods and, most importantly, how those foods make you feel, you need to starve the distractions. That means when you eat, just eat. Focus on your food, the process it went through to end up on your plate, where it came from and how it nourishes you. With this technique, you're more likely to finish a meal feeling satiated.  2.  CONSIDER WHAT YOU'RE NOT WILLING TO DO  This might sound counterintuitive, but it can help provide a "why" when motivation is waning. Declare, in writing, what you are unwilling to do, for example "I am unwilling to be the old dad who cannot play sports with my children".  So consider what you're not willing to accept, write it down, and keep it at the ready.  3.  STOP LABELING FOOD "GOOD" AND "BAD"  You've probably heard someone say they ate something "bad." Maybe you've even said it yourself.  The trouble with 'bad' foods isn't that they'll send you to the grave after a bite or two. The trouble comes when we eat excessive portions of really calorie-dense foods meal after meal, day after  day.  Instead of labeling foods as good or bad, think about which foods you can eat a lot of, and which ones you should just eat a little of. Then, plan ways to eat the foods you really like in portions that fit with your overall goals. A good example of this would be having a slice of pizza alongside a club salad with chicken breast, avocado and a bit of dressing. This is vastly different than 3 slices of pizza, 4 breadsticks with cheese sauce and half of a liter of regular soda.  4.  BRUSH YOUR TEETH AFTER YOU EAT  Getting your mindset in order is important, but sometimes small habits can make a big difference. After eating, you still have the taste of food in their mouth, which often causes people to eat more even if they are full or engage in a nibble or two of dessert.  Brushing your teeth will remove the taste of food from your mouth, and the clean, minty freshness will serve as a cue that mealtime is over.  5.  FOCUS ON CROWDING NOT CUTTING  The most common first step during 'dieting' is to cut. We cut our portion sizes down, we cut out 'bad' foods, we cut out entire food groups. This act of cutting puts [place] and our minds into scarcity mode.  When something is off-limits, even if you're able to avoid it for a  while, you could end up bingeing on it later because you've gone so long without it. So, instead of cutting, focus on crowding. If you crowd your plate and fill it up with more foods like veggies and protein, it simply allows less room for the other stuff. In other words, shift your focus away from what you can't eat, and celebrate the foods that will help you reach your goals.  6.  TAKE TRACKING A STEP FURTHER  Track what you eat, when you ate it, how much you ate and how that food made you feel. Being completely honest with yourself and writing down every single thing that passes through your lips will help you start to notice that maybe you actually do snack, possibly take in more sugar  than you thought, eat when you're bored rather than just hungry or maybe that you have a habit of snacking before bed while watching TV.  The difference from simply tracking your food intake is you're taking into account how food makes you feel, as well as what you're doing while you're eating. This is about becoming more mindful of what, when and why you eat.  7.  PRIORITIZE GOOD SLEEP  One of the strongest risk factors for being overweight is poor sleep. When you're feeling tired, you're more likely to choose unhealthy comfort foods and to skip your workout. Additionally, sleep deprivation may slow down your metabolism. [employer]! Therefore, sleeping 7-8 hours per night can help with weight loss without having to change your diet or increase your physical activity. And if you feel you snore and still wake up tired, talk with me about sleep apnea.  8.  SET ASIDE TIME TO DISCONNECT  Just get out there. Disconnect from the electronics and connect to the elements. Not only will this help reduce stress (a major factor in weight gain) by giving your mind a break from the constant stimulation we've all become so accustomed to, but it may also reprogram your brain to connect with yourself and what you're feeling.

## 2017-08-30 DIAGNOSIS — K219 Gastro-esophageal reflux disease without esophagitis: Secondary | ICD-10-CM | POA: Insufficient documentation

## 2017-08-30 DIAGNOSIS — R7303 Prediabetes: Secondary | ICD-10-CM | POA: Insufficient documentation

## 2017-08-30 DIAGNOSIS — E785 Hyperlipidemia, unspecified: Secondary | ICD-10-CM | POA: Insufficient documentation

## 2017-08-30 DIAGNOSIS — E559 Vitamin D deficiency, unspecified: Secondary | ICD-10-CM | POA: Insufficient documentation

## 2017-08-30 NOTE — Progress Notes (Signed)
FOLLOW UP  Assessment and Plan:   BP/edema Well controlled with current medications  Monitor blood pressure at home; patient to call if consistently greater than 130/80 Continue DASH diet.   Reminder to go to the ER if any CP, SOB, nausea, dizziness, severe HA, changes vision/speech, left arm numbness and tingling and jaw pain.  Cholesterol Currently borderline; working on lifestyle; initiate statin as indicated Continue low cholesterol diet and exercise.  Check lipid panel.   Prediabetes Continue diet and exercise.  Perform daily foot/skin check, notify office of any concerning changes.  Check A1C  Obesity with co morbidities Long discussion about weight loss, diet, and exercise Recommended diet heavy in fruits and veggies and low in animal meats, cheeses, and dairy products, appropriate calorie intake Discussed ideal weight for height and initial weight goal (180 lb) She is on phentermine with benefit and no SE Will follow up in 3 months  Vitamin D Def Below goal at last check; continue supplementation for goal of 70-100 Vit D level  Continue diet and meds as discussed. Further disposition pending results of labs. Discussed med's effects and SE's.   Over 30 minutes of exam, counseling, chart review, and critical decision making was performed.   Future Appointments  Date Time Provider [place]  12/04/2017  9:30 AM [name], PA-C [facility] None    ----------------------------------------------------------------------------------------------------------------------  HPI 57 y.o. female  presents for 3 month follow up on BP, cholesterol, prediabetes, morbid obesity and vitamin D deficiency.   she is prescribed phentermine for weight loss.  While on the medication they have lost 1 lbs since last visit. They deny palpitations, anxiety, trouble sleeping, elevated BP.   BMI is Body mass index is 40.85 kg/m., she is working on diet and exercise. Wt Readings  from Last 3 Encounters:  08/31/17 238 lb (108 kg)  06/29/17 239 lb 9.6 oz (108.7 kg)  06/01/17 242 lb 3.2 oz (109.9 kg)   Typical breakfast: skips on days of intermittent fasting, does crystal light energy drink and a protein bar if needed Typical lunch: Salads (romaine, carrots, cucumbers, tomato, green peas, sunflower seeds, raisins, parmesan cheese with light ranch), carrots, cucumbers, tuna pack, green beans Typical dinner: Green beans, grilled chicken, salmon, squash, zuccini, brown rice/quinoa mix Exercise: Gets lots of steps at work Water intake: 3-4 pints   Today their BP is BP: 122/76  She does workout. She denies chest pain, shortness of breath, dizziness.   She is not on cholesterol medication and denies myalgias. Her cholesterol is not at goal. The cholesterol last visit was:   Lab Results  Component Value Date   CHOL 210 (H) 11/28/2016   HDL 65 11/28/2016   LDLCALC 125 (H) 11/28/2016   TRIG 98 11/28/2016   CHOLHDL 3.2 11/28/2016    She has been working on diet and exercise for glucose managment, and denies foot ulcerations, increased appetite, nausea, paresthesia of the feet, polydipsia, polyuria, visual disturbances, vomiting and weight loss. Last A1C in the office was:  Lab Results  Component Value Date   HGBA1C 6.2% 08/28/2014   Patient is on Vitamin D supplement since last check for vitamin D:    Lab Results  Component Value Date   VD25OH 20 (L) 11/28/2016        Current Medications:  Current Outpatient Medications on File Prior to Visit  Medication Sig  . Cholecalciferol (VITAMIN D3) 5000 units CAPS Take 1 capsule by mouth every morning.  . furosemide (LASIX) 20 MG tablet Take 1  tablet (20 mg total) by mouth daily as needed for edema.  Marland Kitchen ibuprofen (ADVIL,MOTRIN) 800 MG tablet Take 1 tablet (800 mg total) by mouth every 8 (eight) hours as needed.  . methylcellulose (CITRUCEL) oral powder Take by mouth daily.  . phentermine (ADIPEX-P) 37.5 MG tablet Take 1  tablet (37.5 mg total) by mouth daily before breakfast.  . triamcinolone cream (KENALOG) 0.5 % Apply 1 application topically 2 (two) times daily.  . vitamin B-12 (CYANOCOBALAMIN) 500 MCG tablet Take 500 mcg by mouth daily.  . vitamin C (ASCORBIC ACID) 500 MG tablet Take 500 mg by mouth every morning.   Marland Kitchen omeprazole (PRILOSEC) 40 MG capsule TAKE 1 CAPSULE(40 MG) BY MOUTH DAILY (Patient not taking: Reported on 08/31/2017)  . predniSONE (DELTASONE) 20 MG tablet 2 tablets daily for 3 days, 1 tablet daily for 4 days. (Patient not taking: Reported on 06/29/2017)  . ranitidine (ZANTAC) 300 MG tablet TAKE 1 TABLET BY MOUTH AT BEDTIME (Patient not taking: Reported on 08/31/2017)  . sucralfate (CARAFATE) 1 g tablet Take 1 tablet (1 g total) by mouth 4 (four) times daily. (Patient not taking: Reported on 08/31/2017)   No current facility-administered medications on file prior to visit.      Allergies:  Allergies  Allergen Reactions  . Cabbage Swelling    Red / purple cabbage- Lip swelling  . Voltaren [Diclofenac Sodium] Swelling     Medical History:  Past Medical History:  Diagnosis Date  . Biliary colic   . Cholelithiasis   . Constipation   . Family history of adverse reaction to anesthesia    mother-- hard to wake  . GERD (gastroesophageal reflux disease)   . History of ectopic pregnancy 1984   s/p  right salpingectomy  . History of ovarian cyst 1980s   s/p RSO  . Lower extremity edema   . OA (osteoarthritis)    right knee  . Thyroid goiter    per pt hx benign bx on the right--- no issues per pt  . Wears glasses    Family history- Reviewed and unchanged Social history- Reviewed and unchanged   Review of Systems:  Review of Systems  Constitutional: Negative for malaise/fatigue and weight loss.  HENT: Negative for hearing loss and tinnitus.   Eyes: Negative for blurred vision and double vision.  Respiratory: Negative for cough, shortness of breath and wheezing.   Cardiovascular:  Negative for chest pain, palpitations, orthopnea, claudication and leg swelling.  Gastrointestinal: Negative for abdominal pain, blood in stool, constipation, diarrhea, heartburn, melena, nausea and vomiting.  Genitourinary: Negative.   Musculoskeletal: Negative for joint pain and myalgias.  Skin: Negative for rash.  Neurological: Negative for dizziness, tingling, sensory change, weakness and headaches.  Endo/Heme/Allergies: Negative for polydipsia.  Psychiatric/Behavioral: Negative.   All other systems reviewed and are negative.   Physical Exam: BP 122/76   Pulse 94   Temp (!) 97.5 F (36.4 C)   Ht 5\' 4"  (1.626 m)   Wt 238 lb (108 kg)   SpO2 98%   BMI 40.85 kg/m  Wt Readings from Last 3 Encounters:  08/31/17 238 lb (108 kg)  06/29/17 239 lb 9.6 oz (108.7 kg)  06/01/17 242 lb 3.2 oz (109.9 kg)   General Appearance: Well nourished, in no apparent distress. Eyes: PERRLA, EOMs, conjunctiva no swelling or erythema Sinuses: No Frontal/maxillary tenderness ENT/Mouth: Ext aud canals clear, TMs without erythema, bulging. No erythema, swelling, or exudate on post pharynx.  Tonsils not swollen or erythematous. Hearing normal.  Neck: Supple, thyroid normal.  Respiratory: Respiratory effort normal, BS equal bilaterally without rales, rhonchi, wheezing or stridor.  Cardio: RRR with no MRGs. Brisk peripheral pulses without edema.  Abdomen: Soft, + BS.  Non tender, no guarding, rebound, hernias, masses. Lymphatics: Non tender without lymphadenopathy.  Musculoskeletal: Full ROM, 5/5 strength, Normal gait Skin: Warm, dry without rashes, lesions, ecchymosis.  Neuro: Cranial nerves intact. No cerebellar symptoms.  Psych: Awake and oriented X 3, normal affect, Insight and Judgment appropriate.    Izora Ribas, NP 4:44 PM [facility]

## 2017-08-31 ENCOUNTER — Ambulatory Visit: Payer: BLUE CROSS/BLUE SHIELD | Admitting: Adult Health

## 2017-08-31 ENCOUNTER — Encounter: Payer: Self-pay | Admitting: Adult Health

## 2017-08-31 VITALS — BP 122/76 | HR 94 | Temp 97.5°F | Ht 64.0 in | Wt 238.0 lb

## 2017-08-31 DIAGNOSIS — K219 Gastro-esophageal reflux disease without esophagitis: Secondary | ICD-10-CM | POA: Diagnosis not present

## 2017-08-31 DIAGNOSIS — E559 Vitamin D deficiency, unspecified: Secondary | ICD-10-CM | POA: Diagnosis not present

## 2017-08-31 DIAGNOSIS — E785 Hyperlipidemia, unspecified: Secondary | ICD-10-CM

## 2017-08-31 DIAGNOSIS — R7303 Prediabetes: Secondary | ICD-10-CM

## 2017-08-31 DIAGNOSIS — Z79899 Other long term (current) drug therapy: Secondary | ICD-10-CM | POA: Diagnosis not present

## 2017-08-31 MED ORDER — PHENTERMINE HCL 37.5 MG PO TABS: 37.5000 mg | ORAL_TABLET | Freq: Every day | ORAL | 4 refills | Status: DC

## 2017-08-31 NOTE — Patient Instructions (Addendum)
Consider switching vitamin C and vitamin B supplement to a multivitamin - I like to get the fruit gummy ones from [employer] or [employer] :)  Consider keepiing vitamin D on desk at work or with phentermine so you don't forget to take, or can increase to higher dose if you forget to take every day  Try switching things up - don't fast every single day, switch up days where you eat more or less calories, etc  Weight was gained slowly - losing slowly is normal, expected and GOOD!     Aim for 7+ servings of fruits and vegetables daily  80+ fluid ounces of water or unsweet tea for healthy kidneys  Limit alcohol intake  Limit animal fats in diet for cholesterol and heart health - choose grass fed whenever available  Aim for low stress - take time to unwind and care for your mental health  Aim for 150 min of moderate intensity exercise weekly for heart health, and weights twice weekly for bone health  Aim for 7-9 hours of sleep daily

## 2017-09-01 LAB — COMPLETE METABOLIC PANEL WITH GFR
AG RATIO: 1.2 (calc) (ref 1.0–2.5)
ALBUMIN MSPROF: 4.3 g/dL (ref 3.6–5.1)
ALT: 13 U/L (ref 6–29)
AST: 17 U/L (ref 10–35)
Alkaline phosphatase (APISO): 95 U/L (ref 33–130)
BILIRUBIN TOTAL: 0.4 mg/dL (ref 0.2–1.2)
BUN: 13 mg/dL (ref 7–25)
CHLORIDE: 102 mmol/L (ref 98–110)
CO2: 31 mmol/L (ref 20–32)
Calcium: 10.7 mg/dL — ABNORMAL HIGH (ref 8.6–10.4)
Creat: 0.8 mg/dL (ref 0.50–1.05)
GFR, EST AFRICAN AMERICAN: 96 mL/min/{1.73_m2} (ref >=60)
GFR, EST NON AFRICAN AMERICAN: 82 mL/min/{1.73_m2} (ref >=60)
Globulin: 3.6 g/dL (calc) (ref 1.9–3.7)
Glucose, Bld: 94 mg/dL (ref 65–99)
POTASSIUM: 4.2 mmol/L (ref 3.5–5.3)
Sodium: 139 mmol/L (ref 135–146)
TOTAL PROTEIN: 7.9 g/dL (ref 6.1–8.1)

## 2017-09-01 LAB — LIPID PANEL
CHOL/HDL RATIO: 2.9 (calc) (ref <5.0)
Cholesterol: 199 mg/dL (ref <200)
HDL: 68 mg/dL (ref >50)
LDL Cholesterol (Calc): 116 mg/dL (calc) — ABNORMAL HIGH
Non-HDL Cholesterol (Calc): 131 mg/dL (calc) — ABNORMAL HIGH (ref <130)
TRIGLYCERIDES: 56 mg/dL (ref <150)

## 2017-09-01 LAB — CBC WITH DIFFERENTIAL/PLATELET
BASOS ABS: 19 {cells}/uL (ref 0–200)
Basophils Relative: 0.3 %
EOS ABS: 107 {cells}/uL (ref 15–500)
Eosinophils Relative: 1.7 %
HCT: 35.7 % (ref 35.0–45.0)
Hemoglobin: 12.3 g/dL (ref 11.7–15.5)
Lymphs Abs: 2564 cells/uL (ref 850–3900)
MCH: 27.6 pg (ref 27.0–33.0)
MCHC: 34.5 g/dL (ref 32.0–36.0)
MCV: 80 fL (ref 80.0–100.0)
MPV: 11.3 fL (ref 7.5–12.5)
Monocytes Relative: 6.7 %
NEUTROS PCT: 50.6 %
Neutro Abs: 3188 cells/uL (ref 1500–7800)
PLATELETS: 292 10*3/uL (ref 140–400)
RBC: 4.46 10*6/uL (ref 3.80–5.10)
RDW: 12.8 % (ref 11.0–15.0)
TOTAL LYMPHOCYTE: 40.7 %
WBC mixed population: 422 cells/uL (ref 200–950)
WBC: 6.3 10*3/uL (ref 3.8–10.8)

## 2017-09-01 LAB — TSH: TSH: 0.84 m[IU]/L (ref 0.40–4.50)

## 2017-09-01 LAB — MAGNESIUM: Magnesium: 2 mg/dL (ref 1.5–2.5)

## 2017-09-01 LAB — HEMOGLOBIN A1C
EAG (MMOL/L): 6.8 (calc)
Hgb A1c MFr Bld: 5.9 % of total Hgb — ABNORMAL HIGH (ref <5.7)
MEAN PLASMA GLUCOSE: 123 (calc)

## 2017-09-01 LAB — VITAMIN D 25 HYDROXY (VIT D DEFICIENCY, FRACTURES): Vit D, 25-Hydroxy: 29 ng/mL — ABNORMAL LOW (ref 30–100)

## 2017-12-02 NOTE — Progress Notes (Deleted)
Complete Physical  Assessment and Plan:  Gastroesophageal reflux disease, esophagitis presence not specified Continue meds, but no responsive, do low fat diet, get [place] AB, if negative will refer to GI for possible HIDA/EGD  Routine general medical examination at a health care facility Need MGM and PAP, possible prolapse so will go to GYN  Screening cholesterol level -     Lipid panel  Screening for cardiovascular condition -     EKG 12-Lead  Screening for hematuria or proteinuria -     Urinalysis, Routine w reflex microscopic -     Microalbumin / creatinine urine ratio  Screening, anemia, deficiency, iron -     Iron,Total/Total Iron Binding Cap -     Vitamin B12  Abnormal glucose Discussed general issues about diabetes pathophysiology and management., Educational material distributed., Suggested low cholesterol diet., Encouraged aerobic exercise., Discussed foot care., Reminded to get yearly retinal exam. -     Hemoglobin A1c  Edema, unspecified type - weight loss discussed, continue compression stockings and elevation -     furosemide (LASIX) 20 MG tablet; Take 1 tablet (20 mg total) by mouth daily as needed for edema. -     TSH  Medication management -     CBC with Differential/Platelet -     BASIC METABOLIC PANEL WITH GFR -     Hepatic function panel -     Magnesium  Vitamin D deficiency -     VITAMIN D 25 Hydroxy (Vit-D Deficiency, Fractures)  Screening for viral disease -     HIV antibody -     Hepatitis C antibody  Epigastric pain + murphy's check AB [place], do low fat diet, if + stone will refer gen surgeon, if negative will refer GI for possible HIDA/EGD -     Amylase -     US Abdomen Complete; Future  Need for Tdap vaccination -     Tdap vaccine greater than or equal to 7yo IM    Discussed med's effects and SE's. Screening labs and tests as requested with regular follow-up as recommended. Over 40 minutes of exam, counseling, chart review, and complex,  high level critical decision making was performed this visit.  Future Appointments  Date Time Provider [place]  12/04/2017  9:30 AM [name], PA-C [facility] None  01/07/2019  9:30 AM [name], PA-C [facility] None    HPI  57 y.o. MAAF female  presents for a complete physical and follow up for has Morbid obesity ([place]); Hyperlipidemia; Vitamin D deficiency; and Prediabetes on their problem list..  Her blood pressure has not been checked at home, today their BP is  , states did not sleep well with stomach hurting.  She does not workout. She denies chest pain, shortness of breath, dizziness.  States has had fluid retention, has had normal echo, was bilateral hand and leg tightness/swelling. No family history of RA/autoimmune.  She continue to have AB pain, on prilosec 40mg  in AM, and zantac, not on carafate and has been avoiding acidic foods, still having pain, had pain all night yesterday.  She is not  on cholesterol medication and denies myalgias. Her cholesterol is not at goal. The cholesterol last visit was:   Lab Results  Component Value Date   CHOL 199 08/31/2017   HDL 68 08/31/2017   LDLCALC 116 (H) 08/31/2017   TRIG 56 08/31/2017   CHOLHDL 2.9 08/31/2017   She has been working on diet and exercise for prediabetes, and denies paresthesia of the  feet, polydipsia, polyuria and visual disturbances. Last A1C in the office was:  Lab Results  Component Value Date   HGBA1C 5.9 (H) 08/31/2017   Lab Results  Component Value Date   GFRAA 96 08/31/2017   Patient is on Vitamin D supplement.   Lab Results  Component Value Date   VD25OH 29 (L) 08/31/2017     BMI is There is no height or weight on file to calculate BMI., she is working on diet and exercise. Wt Readings from Last 3 Encounters:  08/31/17 238 lb (108 kg)  06/29/17 239 lb 9.6 oz (108.7 kg)  06/01/17 242 lb 3.2 oz (109.9 kg)   Has had painful intercourse with initial penetration, states some dryness  but will feel pressure/something falling out with intercourse and will feel something with urination and will have feel decreased stream.    Current Medications:  Current Outpatient Medications on File Prior to Visit  Medication Sig Dispense Refill  . Cholecalciferol (VITAMIN D3) 5000 units CAPS Take 1 capsule by mouth every morning.    . furosemide (LASIX) 20 MG tablet Take 1 tablet (20 mg total) by mouth daily as needed for edema. 30 tablet 3  . ibuprofen (ADVIL,MOTRIN) 800 MG tablet Take 1 tablet (800 mg total) by mouth every 8 (eight) hours as needed. 30 tablet 0  . methylcellulose (CITRUCEL) oral powder Take by mouth daily.    . phentermine (ADIPEX-P) 37.5 MG tablet Take 1 tablet (37.5 mg total) by mouth daily before breakfast. 30 tablet 4  . triamcinolone cream (KENALOG) 0.5 % Apply 1 application topically 2 (two) times daily. 80 g 2  . vitamin B-12 (CYANOCOBALAMIN) 500 MCG tablet Take 500 mcg by mouth daily.    . vitamin C (ASCORBIC ACID) 500 MG tablet Take 500 mg by mouth every morning.      No current facility-administered medications on file prior to visit.    Allergies:  Allergies  Allergen Reactions  . Cabbage Swelling    Red / purple cabbage- Lip swelling  . Voltaren [Diclofenac Sodium] Swelling   Medical History:  She has Morbid obesity ([place]); Hyperlipidemia; Vitamin D deficiency; and Prediabetes on their problem list.   Health Maintenance:   Tetanus: DUE Pneumovax: Prevnar 13:  Flu vaccine: 2017 Zostavax:  No LMP recorded. Patient is postmenopausal.  Pap: Saw Dr. Ouida Sills in the past, last pap 3 years ago MGM: 3 years ago DUE DEXA: N/A Colonoscopy: 2016 EGD: N/A  Patient Care Team: Unk Pinto, MD as PCP - General (Internal Medicine)  Surgical History:  She has a past surgical history that includes Knee arthroscopy (Right, 2010); Colonoscopy (10-26-2014  dr Earlean Shawl); Ectopic pregnancy surgery (1990s); Right oophorectomy (1980s); and Cholecystectomy  (N/A, 12/17/2016). Family History:  Herfamily history includes Atrial fibrillation in her mother; Hypertension in her brother, brother, and mother; Kidney disease in her mother; Stroke in her brother. Social History:  She reports that she quit smoking about 29 years ago. Her smoking use included cigarettes. She quit after 5.00 years of use. She has never used smokeless tobacco. She reports that she drinks alcohol. She reports that she does not use drugs. 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  HENT: Negative for congestion, ear pain and sore throat.   Eyes: Negative.   Respiratory: Negative for cough, shortness of breath and wheezing.   Cardiovascular: Positive for leg swelling. Negative for chest  pain and palpitations.  Gastrointestinal: Positive for heartburn. Negative for blood in stool, constipation, diarrhea, melena, nausea and vomiting.  Genitourinary: Negative.   Musculoskeletal: Negative for joint pain.  Skin: Negative.   Neurological: Negative for dizziness, sensory change, loss of consciousness and headaches.  Psychiatric/Behavioral: Negative for depression. The patient is not nervous/anxious and does not have insomnia.     Physical Exam: Estimated body mass index is 40.85 kg/m as calculated from the following:   Height as of 08/31/17: 5\' 4"  (1.626 m).   Weight as of 08/31/17: 238 lb (108 kg). There were no vitals taken for this visit. General Appearance: Well nourished, in no apparent distress.  Eyes: PERRLA, EOMs, conjunctiva no swelling or erythema, normal fundi and vessels.  Sinuses: No Frontal/maxillary tenderness  ENT/Mouth: Ext aud canals clear, normal light reflex with TMs without erythema, bulging. Good dentition. No erythema, swelling, or exudate on post pharynx. Tonsils not swollen or erythematous. Hearing normal.  Neck: Supple, thyroid  normal. No bruits  Respiratory: Respiratory effort normal, BS equal bilaterally without rales, rhonchi, wheezing or stridor.  Cardio: RRR without murmurs, rubs or gallops. Brisk peripheral pulses without edema.  Chest: symmetric, with normal excursions and percussion.  Breasts: defer GYN.  Abdomen: Soft, + epigastric and RUQ with + murphy's, no guarding, rebound, hernias, masses, or organomegaly.  Lymphatics: Non tender without lymphadenopathy.  Genitourinary: defer GYN Musculoskeletal: Full ROM all peripheral extremities,5/5 strength, and normal gait.  Skin: Warm, dry without rashes, lesions, ecchymosis. Neuro: Cranial nerves intact, reflexes equal bilaterally. Normal muscle tone, no cerebellar symptoms. Sensation intact.  Psych: Awake and oriented X 3, normal affect, Insight and Judgment appropriate.   EKG: WNL no ST changes. AORTA SCAN: defer  [name] 5:49 AM [facility]

## 2017-12-04 ENCOUNTER — Encounter: Payer: Self-pay | Admitting: Physician Assistant

## 2018-01-25 NOTE — Progress Notes (Signed)
Complete Physical  Assessment and Plan:  Routine general medical examination at a health care facility 1 year GET MGM Follow up GYN for PAP  Morbid obesity ([place]) - - follow up 3 months for progress monitoring - long discussion about weight loss, diet, and exercise, will start the patient on phentermine- hand out given and AE's discussed, will do close follow up.  -     TSH -     EKG 12-Lead -     phentermine (ADIPEX-P) 37.5 MG tablet; Take 1 tablet (37.5 mg total) by mouth daily before breakfast.  Hyperlipidemia, unspecified hyperlipidemia type check lipids decrease fatty foods increase activity.  -     TSH -     Lipid panel -     EKG 12-Lead  Prediabetes Discussed disease progression and risks Discussed diet/exercise, weight management and risk modification -     TSH -     Lipid panel -     Hemoglobin A1c  Vitamin D deficiency -     VITAMIN D 25 Hydroxy (Vit-D Deficiency, Fractures)  Medication management -     CBC with Differential/Platelet -     COMPLETE METABOLIC PANEL WITH GFR -     Magnesium  Screening for hematuria or proteinuria -     Urinalysis, Routine w reflex microscopic -     Microalbumin / creatinine urine ratio  Discussed med's effects and SE's. Screening labs and tests as requested with regular follow-up as recommended. Over 40 minutes of exam, counseling, chart review, and complex, high level critical decision making was performed this visit.  Future Appointments  Date Time Provider [place]  02/09/2019 10:00 AM [name], PA-C [facility] None    HPI  57 y.o. MAAF female  presents for a complete physical and follow up for has Morbid obesity ([place]); Hyperlipidemia; Vitamin D deficiency; and Prediabetes on their problem list..  Her blood pressure has not been checked at home, today their BP is BP: 120/80 She does not workout. She denies chest pain, shortness of breath, dizziness.   She is not  on cholesterol medication and denies  myalgias. Her cholesterol is not at goal. The cholesterol last visit was:   Lab Results  Component Value Date   CHOL 199 08/31/2017   HDL 68 08/31/2017   LDLCALC 116 (H) 08/31/2017   TRIG 56 08/31/2017   CHOLHDL 2.9 08/31/2017   She has been working on diet and exercise for prediabetes, and denies paresthesia of the feet, polydipsia, polyuria and visual disturbances. Last A1C in the office was:  Lab Results  Component Value Date   HGBA1C 5.9 (H) 08/31/2017   Lab Results  Component Value Date   GFRAA 96 08/31/2017   Patient is on Vitamin D supplement, 5000.   Lab Results  Component Value Date   VD25OH 29 (L) 08/31/2017     BMI is Body mass index is 39.48 kg/m., she is working on diet and exercise.She is still on phentermine, she is taking it when she is having a bad day or a lot of things going on. Not taking daily. She is down 13 lbs from 06/2017.  Wt Readings from Last 3 Encounters:  01/27/18 230 lb (104.3 kg)  08/31/17 238 lb (108 kg)  06/29/17 239 lb 9.6 oz (108.7 kg)   Has had painful intercourse with initial penetration, states some dryness, no longer feeling something falling.   Current Medications:  Current Outpatient Medications on File Prior to Visit  Medication Sig Dispense Refill  .  Cholecalciferol (VITAMIN D3) 5000 units CAPS Take 1 capsule by mouth every morning.    Marland Kitchen ibuprofen (ADVIL,MOTRIN) 800 MG tablet Take 1 tablet (800 mg total) by mouth every 8 (eight) hours as needed. 30 tablet 0  . methylcellulose (CITRUCEL) oral powder Take by mouth daily.    Marland Kitchen triamcinolone cream (KENALOG) 0.5 % Apply 1 application topically 2 (two) times daily. 80 g 2  . vitamin B-12 (CYANOCOBALAMIN) 500 MCG tablet Take 500 mcg by mouth daily.    . vitamin C (ASCORBIC ACID) 500 MG tablet Take 500 mg by mouth every morning.     . furosemide (LASIX) 20 MG tablet Take 1 tablet (20 mg total) by mouth daily as needed for edema. 30 tablet 3   No current facility-administered medications  on file prior to visit.    Allergies:  Allergies  Allergen Reactions  . Cabbage Swelling    Red / purple cabbage- Lip swelling  . Voltaren [Diclofenac Sodium] Swelling   Medical History:  She has Morbid obesity ([place]); Hyperlipidemia; Vitamin D deficiency; and Prediabetes on their problem list.   Health Maintenance:    There is no immunization history for the selected administration types on file for this patient.  Tetanus: DUE Pneumovax: Prevnar 13:  Flu vaccine: 2019 at work Zostavax:  No LMP recorded. Patient is postmenopausal.  Pap: Saw Dr. Ouida Sills in the past, last pap 3 years ago MGM: 3 years ago DUE DEXA: N/A Colonoscopy: 10/2014 EGD: N/A  Patient Care Team: Unk Pinto, MD as PCP - General (Internal Medicine)  Surgical History:  She has a past surgical history that includes Knee arthroscopy (Right, 2010); Colonoscopy (10-26-2014  dr Earlean Shawl); Ectopic pregnancy surgery (1990s); Right oophorectomy (1980s); and Cholecystectomy (N/A, 12/17/2016). Family History:  Herfamily history includes Atrial fibrillation in her mother; Hypertension in her brother, brother, and mother; Kidney disease in her mother; Stroke in her brother. Social History:  She reports that she quit smoking about 29 years ago. Her smoking use included cigarettes. She quit after 5.00 years of use. She has never used smokeless tobacco. She reports that she drinks alcohol. She reports that she does not use drugs. 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  HENT: Negative for congestion, ear pain and sore throat.   Eyes: Negative.   Respiratory: Negative for cough, shortness of breath and wheezing.   Cardiovascular: Negative for chest pain, palpitations and leg swelling.  Gastrointestinal: Negative for blood in stool, constipation, diarrhea, heartburn, melena, nausea and  vomiting.  Genitourinary: Negative.   Musculoskeletal: Negative for joint pain.  Skin: Negative.   Neurological: Negative for dizziness, sensory change, loss of consciousness and headaches.  Psychiatric/Behavioral: Negative for depression. The patient is not nervous/anxious and does not have insomnia.     Physical Exam: Estimated body mass index is 39.48 kg/m as calculated from the following:   Height as of this encounter: 5\' 4"  (1.626 m).   Weight as of this encounter: 230 lb (104.3 kg). BP 120/80   Pulse 68   Temp 97.9 F (36.6 C)   Resp 14   Ht 5\' 4"  (1.626 m)   Wt 230 lb (104.3 kg)   SpO2 97%   BMI 39.48 kg/m  General Appearance: Well nourished, in no apparent distress.  Eyes: PERRLA, EOMs, conjunctiva no swelling or erythema, normal fundi and vessels.  Sinuses: No Frontal/maxillary tenderness  ENT/Mouth: Ext aud  canals clear, normal light reflex with TMs without erythema, bulging. Good dentition. No erythema, swelling, or exudate on post pharynx. Tonsils not swollen or erythematous. Hearing normal.  Neck: Supple, thyroid normal. No bruits  Respiratory: Respiratory effort normal, BS equal bilaterally without rales, rhonchi, wheezing or stridor.  Cardio: RRR without murmurs, rubs or gallops. Brisk peripheral pulses without edema.  Chest: symmetric, with normal excursions and percussion.  Breasts: defer GYN.  Abdomen: Soft,  no guarding, rebound, hernias, masses, or organomegaly.  Lymphatics: Non tender without lymphadenopathy.  Genitourinary: defer GYN Musculoskeletal: Full ROM all peripheral extremities,5/5 strength, and normal gait.  Skin: Warm, dry without rashes, lesions, ecchymosis. Neuro: Cranial nerves intact, reflexes equal bilaterally. Normal muscle tone, no cerebellar symptoms. Sensation intact.  Psych: Awake and oriented X 3, normal affect, Insight and Judgment appropriate.   EKG: WNL no ST changes. AORTA SCAN: defer  [name] 10:29 AM [facility]

## 2018-01-27 ENCOUNTER — Ambulatory Visit (INDEPENDENT_AMBULATORY_CARE_PROVIDER_SITE_OTHER): Admitting: Physician Assistant

## 2018-01-27 ENCOUNTER — Encounter: Payer: Self-pay | Admitting: Physician Assistant

## 2018-01-27 VITALS — BP 120/80 | HR 68 | Temp 97.9°F | Resp 14 | Ht 64.0 in | Wt 230.0 lb

## 2018-01-27 DIAGNOSIS — E785 Hyperlipidemia, unspecified: Secondary | ICD-10-CM

## 2018-01-27 DIAGNOSIS — Z1389 Encounter for screening for other disorder: Secondary | ICD-10-CM

## 2018-01-27 DIAGNOSIS — Z131 Encounter for screening for diabetes mellitus: Secondary | ICD-10-CM

## 2018-01-27 DIAGNOSIS — E559 Vitamin D deficiency, unspecified: Secondary | ICD-10-CM

## 2018-01-27 DIAGNOSIS — Z1329 Encounter for screening for other suspected endocrine disorder: Secondary | ICD-10-CM

## 2018-01-27 DIAGNOSIS — Z0000 Encounter for general adult medical examination without abnormal findings: Secondary | ICD-10-CM

## 2018-01-27 DIAGNOSIS — I10 Essential (primary) hypertension: Secondary | ICD-10-CM

## 2018-01-27 DIAGNOSIS — Z79899 Other long term (current) drug therapy: Secondary | ICD-10-CM

## 2018-01-27 DIAGNOSIS — Z13220 Encounter for screening for lipoid disorders: Secondary | ICD-10-CM

## 2018-01-27 DIAGNOSIS — Z23 Encounter for immunization: Secondary | ICD-10-CM

## 2018-01-27 DIAGNOSIS — Z136 Encounter for screening for cardiovascular disorders: Secondary | ICD-10-CM | POA: Diagnosis not present

## 2018-01-27 DIAGNOSIS — R7303 Prediabetes: Secondary | ICD-10-CM

## 2018-01-27 DIAGNOSIS — Z130 Encounter for screening for diseases of the blood and blood-forming organs and certain disorders involving the immune mechanism: Secondary | ICD-10-CM

## 2018-01-27 MED ORDER — PHENTERMINE HCL 37.5 MG PO TABS: 37.5000 mg | ORAL_TABLET | Freq: Every day | ORAL | 4 refills | Status: DC

## 2018-01-27 NOTE — Patient Instructions (Addendum)
Look up mindful eating on line Start to rate your hunger from 1-10 before you eat your food  Diet soda leads to weight gain.  We recently discovered that the artifical sugar in the soda stops an enzyme in your stomach that is suppose to signal that your brain is full. So patients that drink a lot of diet soda will never feel full and tend to over eat. So please cut back on diet soda and it can help with your weight loss.   A study found that two or more sodas a day increased the risk of hypertension, diabetes, stroke.   Another recent study found that higher consumption of soda and juice was associated with increased risk of cancer and breast cancer.   In animal studies artifical sweeteners affect the gut bacteria and may contribute to chronic disease this way.    HOW TO SCHEDULE A MAMMOGRAM  [facility]  7 a.m.-6:30 p.m., Monday 7 a.m.-5 p.m., Tuesday-Friday Schedule an appointment by calling [phone].     VAGINAL DRYNESS OVERVIEW   Do the estrogen cream 1/2 the tube daily for 2 weeks, then do 3-4 days a week for another 2 weeks and stop.  Follow up with GYN    Vaginal dryness, also known as atrophic vaginitis, is a common condition in postmenopausal women. This condition is also common in women who have had both ovaries removed at the time of hysterectomy.   Some women have uncomfortable symptoms of vaginal dryness, such as pain with sex, burning vaginal discomfort or itching, or abnormal vaginal discharge, while others have no symptoms at all.  VAGINAL DRYNESS CAUSES   Estrogen helps to keep the vagina moist and to maintain thickness of the vaginal lining. Vaginal dryness occurs when the ovaries produce a decreased amount of estrogen. This can occur at certain times in a woman's life, and may be permanent or temporary. Times when less estrogen is made include: ?At the time of menopause. ?After surgical removal of the ovaries, chemotherapy, or  radiation therapy of the pelvis for cancer. ?After having a baby, particularly in women who breastfeed. ?While using certain medications, such as danazol, medroxyprogesterone (brand names: Provera or DepoProvera), leuprolide (brand name: Lupron), or nafarelin. When these medications are stopped, estrogen production resumes.  Women who smoke cigarettes have been shown to have an increased risk of an earlier menopause transition as compared to non-smokers. Therefore, atrophic vaginitis symptoms may appear at a younger age in this population.  VAGINAL DRYNESS TREATMENT   There are three treatment options for women with vaginal dryness:  Vaginal lubricants and moisturizers - Vaginal lubricants and moisturizers can be purchased without a prescription. These products do not contain any hormones and have virtually no side effects. - Albolene is found in the facial cleanser section at [facility], [facility], or [employer]. It is a large jar with a blue top. This is the best lubricant for women because it is hypoallergenic. -Natural lubricants, such as olive, avocado or peanut oil, are easily available products that may be used as a lubricant with sex.  -Vaginal moisturizes (eg, Replens, Moist Again, Vagisil, K-Y Silk-E, and Feminease) are formulated to allow water to be retained in the vaginal tissues. Moisturizers are applied into the vagina three times weekly to allow a continued moisturizing effect. These should not be used just before having sex, as they can be irritating.  Vaginal estrogen - Vaginal estrogen is the most effective treatment option for women with vaginal dryness. Vaginal  estrogen must be prescribed by a healthcare provider. Very low doses of vaginal estrogen can be used when it is put into the vagina to treat vaginal dryness. A small amount of estrogen is absorbed into the bloodstream, but only about 100 times less than when using estrogen pills or tablets. As a result, there is a much lower risk of  side effects, such as blood clots, breast cancer, and heart attack, compared with other estrogen-containing products (birth control pills, menopausal hormone therapy).   Ospemifene - Ospemifene is a prescription medication that is similar to estrogen, but is not estrogen. In the vaginal tissue, it acts similarly to estrogen. In the breast tissue, it acts as an estrogen blocker. It comes in a pill, and is prescribed for women who want to use an estrogen-like medication for vaginal dryness or painful sex associated with vaginal dryness, but prefer not to use a vaginal medication. The medication may cause hot flashes as a side effect. This type of medication may increase the risk of blood clots or uterine cancer. Further study of ospemifene is needed to evaluate the risk of these complications. This medication has not been tested in women who have had breast cancer or are at a high risk of developing breast cancer.    Sexual activity - Vaginal estrogen improves vaginal dryness quickly, usually within a few weeks. You may continue to have sex as you treat vaginal dryness because sex itself can help to keep the vaginal tissues healthy. Vaginal intercourse may help the vaginal tissues by keeping them soft and stretchable and preventing the tissues from shrinking.  If sex continues to be painful despite treatment for vaginal dryness, talk to your healthcare provider.      When it comes to diets, agreement about the perfect plan isn't easy to find, even among the experts. Experts at the [employer] developed an idea known as the Healthy Eating Plate. Just imagine a plate divided into logical, healthy portions.  The emphasis is on diet quality:  Load up on vegetables and fruits - one-half of your plate: Aim for color and variety, and remember that potatoes don't count.  Go for whole grains - one-quarter of your plate: Whole wheat, barley, wheat berries, quinoa, oats, brown rice, and  foods made with them. If you want pasta, go with whole wheat pasta.  Protein power - one-quarter of your plate: Fish, chicken, beans, and nuts are all healthy, versatile protein sources. Limit red meat.  The diet, however, does go beyond the plate, offering a few other suggestions.  Use healthy plant oils, such as olive, canola, soy, corn, sunflower and peanut. Check the labels, and avoid partially hydrogenated oil, which have unhealthy trans fats.  If you're thirsty, drink water. Coffee and tea are good in moderation, but skip sugary drinks and limit milk and dairy products to one or two daily servings.  The type of carbohydrate in the diet is more important than the amount. Some sources of carbohydrates, such as vegetables, fruits, whole grains, and beans-are healthier than others.  Finally, stay active.

## 2018-01-28 LAB — LIPID PANEL
CHOL/HDL RATIO: 3 (calc) (ref <5.0)
Cholesterol: 206 mg/dL — ABNORMAL HIGH (ref <200)
HDL: 69 mg/dL (ref >50)
LDL Cholesterol (Calc): 121 mg/dL (calc) — ABNORMAL HIGH
NON-HDL CHOLESTEROL (CALC): 137 mg/dL — AB (ref <130)
Triglycerides: 70 mg/dL (ref <150)

## 2018-01-28 LAB — COMPLETE METABOLIC PANEL WITH GFR
AG Ratio: 1.2 (calc) (ref 1.0–2.5)
ALBUMIN MSPROF: 4.3 g/dL (ref 3.6–5.1)
ALT: 14 U/L (ref 6–29)
AST: 16 U/L (ref 10–35)
Alkaline phosphatase (APISO): 108 U/L (ref 33–130)
BUN: 12 mg/dL (ref 7–25)
CALCIUM: 10.1 mg/dL (ref 8.6–10.4)
CO2: 26 mmol/L (ref 20–32)
CREATININE: 0.93 mg/dL (ref 0.50–1.05)
Chloride: 103 mmol/L (ref 98–110)
GFR, EST AFRICAN AMERICAN: 80 mL/min/{1.73_m2} (ref >=60)
GFR, EST NON AFRICAN AMERICAN: 69 mL/min/{1.73_m2} (ref >=60)
GLOBULIN: 3.5 g/dL (ref 1.9–3.7)
Glucose, Bld: 94 mg/dL (ref 65–99)
Potassium: 4 mmol/L (ref 3.5–5.3)
Sodium: 140 mmol/L (ref 135–146)
TOTAL PROTEIN: 7.8 g/dL (ref 6.1–8.1)
Total Bilirubin: 0.4 mg/dL (ref 0.2–1.2)

## 2018-01-28 LAB — URINALYSIS, ROUTINE W REFLEX MICROSCOPIC
BILIRUBIN URINE: NEGATIVE
Bacteria, UA: NONE SEEN /HPF
Glucose, UA: NEGATIVE
HGB URINE DIPSTICK: NEGATIVE
HYALINE CAST: NONE SEEN /LPF
Ketones, ur: NEGATIVE
Nitrite: NEGATIVE
PROTEIN: NEGATIVE
Specific Gravity, Urine: 1.022 (ref 1.001–1.03)
pH: 6 (ref 5.0–8.0)

## 2018-01-28 LAB — CBC WITH DIFFERENTIAL/PLATELET
BASOS PCT: 0.3 %
Basophils Absolute: 19 cells/uL (ref 0–200)
EOS ABS: 221 {cells}/uL (ref 15–500)
Eosinophils Relative: 3.5 %
HCT: 37 % (ref 35.0–45.0)
HEMOGLOBIN: 12.2 g/dL (ref 11.7–15.5)
Lymphs Abs: 2545 cells/uL (ref 850–3900)
MCH: 27.3 pg (ref 27.0–33.0)
MCHC: 33 g/dL (ref 32.0–36.0)
MCV: 82.8 fL (ref 80.0–100.0)
MPV: 12 fL (ref 7.5–12.5)
Monocytes Relative: 8 %
NEUTROS ABS: 3011 {cells}/uL (ref 1500–7800)
Neutrophils Relative %: 47.8 %
Platelets: 303 10*3/uL (ref 140–400)
RBC: 4.47 10*6/uL (ref 3.80–5.10)
RDW: 12.8 % (ref 11.0–15.0)
TOTAL LYMPHOCYTE: 40.4 %
WBC mixed population: 504 cells/uL (ref 200–950)
WBC: 6.3 10*3/uL (ref 3.8–10.8)

## 2018-01-28 LAB — HEMOGLOBIN A1C
Hgb A1c MFr Bld: 6 % of total Hgb — ABNORMAL HIGH (ref <5.7)
MEAN PLASMA GLUCOSE: 126 (calc)
eAG (mmol/L): 7 (calc)

## 2018-01-28 LAB — TSH: TSH: 1.14 mIU/L (ref 0.40–4.50)

## 2018-01-28 LAB — MICROALBUMIN / CREATININE URINE RATIO
Creatinine, Urine: 266 mg/dL (ref 20–275)
Microalb Creat Ratio: 3 mcg/mg creat (ref <30)
Microalb, Ur: 0.8 mg/dL

## 2018-01-28 LAB — MAGNESIUM: Magnesium: 1.9 mg/dL (ref 1.5–2.5)

## 2018-01-28 LAB — VITAMIN D 25 HYDROXY (VIT D DEFICIENCY, FRACTURES): Vit D, 25-Hydroxy: 38 ng/mL (ref 30–100)

## 2018-04-13 ENCOUNTER — Other Ambulatory Visit: Payer: Self-pay | Admitting: Physician Assistant

## 2018-04-13 DIAGNOSIS — Z1231 Encounter for screening mammogram for malignant neoplasm of breast: Secondary | ICD-10-CM

## 2018-05-12 ENCOUNTER — Ambulatory Visit
Admission: RE | Admit: 2018-05-12 | Discharge: 2018-05-12 | Disposition: A | Discharge status: Home / Self Care | Payer: PRIVATE HEALTH INSURANCE | Source: Ambulatory Visit | Attending: Physician Assistant | Admitting: Physician Assistant

## 2018-05-12 DIAGNOSIS — Z1231 Encounter for screening mammogram for malignant neoplasm of breast: Secondary | ICD-10-CM

## 2018-05-21 ENCOUNTER — Encounter: Payer: Self-pay | Admitting: Adult Health Nurse Practitioner

## 2018-05-21 ENCOUNTER — Ambulatory Visit (INDEPENDENT_AMBULATORY_CARE_PROVIDER_SITE_OTHER): Admitting: Adult Health Nurse Practitioner

## 2018-05-21 DIAGNOSIS — R7303 Prediabetes: Secondary | ICD-10-CM | POA: Diagnosis not present

## 2018-05-21 DIAGNOSIS — E559 Vitamin D deficiency, unspecified: Secondary | ICD-10-CM

## 2018-05-21 DIAGNOSIS — Z23 Encounter for immunization: Secondary | ICD-10-CM

## 2018-05-21 DIAGNOSIS — G8929 Other chronic pain: Secondary | ICD-10-CM

## 2018-05-21 DIAGNOSIS — E785 Hyperlipidemia, unspecified: Secondary | ICD-10-CM

## 2018-05-21 DIAGNOSIS — M25561 Pain in right knee: Secondary | ICD-10-CM

## 2018-05-21 MED ORDER — PHENTERMINE HCL 37.5 MG PO TABS: 37.5000 mg | ORAL_TABLET | Freq: Every day | ORAL | 3 refills | Status: DC

## 2018-05-21 NOTE — Progress Notes (Signed)
FOLLOW UP  Assessment and Plan:    Vicki Shea was seen today for follow-up.  Diagnoses and all orders for this visit:  Morbid obesity ([place])  Hyperlipidemia, unspecified hyperlipidemia type -     Lipid panel  Vitamin D deficiency -     VITAMIN D 25 Hydroxy (Vit-D Deficiency, Fractures)  Prediabetes -     Hemoglobin A1c -     Insulin, random  Chronic pain of right knee     Morbid Obesity with co morbidities Long discussion about weight loss, diet, and exercise Recommended diet heavy in fruits and veggies and low in animal meats, cheeses, and dairy products, appropriate calorie intake Discussed ideal weight for height 150 and initial weight goal of 220lbs next follow up. Patient will work on continued dietary changes, plans on increasing exercise when knee improves. Discussed low impact options for her Will follow up in 3 months -     CBC with Differential/Platelet -     COMPLETE METABOLIC PANEL WITH GFR -     Hemoglobin A1c -     Insulin, random -     phentermine (ADIPEX-P) 37.5 MG tablet; Take 1 tablet (37.5 mg total) by mouth daily before breakfast.   Vitamin D Def At goal at last visit; continue supplementation to maintain goal of 70-100 Defer Vit D level  Need for Shingles Vaccination Discussed vaccination with patient Recommend to get Shingrix at local pharmacy.  Continue diet and meds as discussed. Further disposition pending results of labs. Discussed med's effects and SE's.   Over 30 minutes of exam, counseling, chart review, and critical decision making was performed.   Future Appointments  Date Time Provider [place]  08/27/2018  9:00 AM Garnet Sierras, NP [facility] None  02/09/2019 10:00 AM [name], PA-C [facility] None    ----------------------------------------------------------------------------------------------------------------------  HPI 58 y.o. MB female  presents for 3 month follow up on hypertension, cholesterol,  diabetes, weight and vitamin D deficiency.  She has been doing well over all since last visit.  She reports that her right knee has been causing her an increase in discomfort.  She reports there is generalized pain around the knee with pinpoint tenderness on the outside.  She reports that she has had injections in the past for this with beneficial results.  She was follow with Ortho at [facility].  Reports that the employee parking lot is farm from her building. With her knee causing and increase in pain with walking and some clicking and sometimes feels like it is going to "give way" she is requesting temporary parking tag.  She reports her pain is increased with prolonged walking or going up or down stairs.  Right now she tried to avoid step related to this.    BMI is Body mass index is 39.86 kg/m., she has been working on diet and exercise. She has been making modification to her diet.  She has cut out sugary drinks and sodas.  She allows herself one sugary coffee drink a month.  She reports that she does not eat bread but does like pasta.  She has recently replaced her pasta with a vegetable based product and even gotten her husband onboard with this as he is diabetic.  She is taking phentermine and tolerating this well.  She would like the formulation changed to capsules as she reports the pharmacy she uses switch mfg and she feels like it does not work as well.  She is up 2lbs from last visit and reports she did  not do well with her diet around the holidays.  She has changed this and also been tracking her steps and getting in 10,000 steps every day. Wt Readings from Last 3 Encounters:  05/21/18 232 lb 3.2 oz (105.3 kg)  01/27/18 230 lb (104.3 kg)  08/31/17 238 lb (108 kg)    Her blood pressure has been controlled at home, today their BP is BP: 122/86  She does not workout. She denies chest pain, shortness of breath, dizziness.   She is not on cholesterol medication Her cholesterol is not at goal. The  cholesterol last visit was:   Lab Results  Component Value Date   CHOL 206 (H) 01/27/2018   HDL 69 01/27/2018   LDLCALC 121 (H) 01/27/2018   TRIG 70 01/27/2018   CHOLHDL 3.0 01/27/2018    She has been working on diet and exercise for prediabetes, and denies hyperglycemia, hypoglycemia , polydipsia, polyuria, visual disturbances and vomiting. Last A1C in the office was:  Lab Results  Component Value Date   HGBA1C 6.0 (H) 01/27/2018   Patient is on Vitamin D supplement.   Lab Results  Component Value Date   VD25OH 38 01/27/2018        Current Medications:  Current Outpatient Medications on File Prior to Visit  Medication Sig  . Cholecalciferol (VITAMIN D3) 5000 units CAPS Take 1 capsule by mouth every morning.  Marland Kitchen ibuprofen (ADVIL,MOTRIN) 800 MG tablet Take 1 tablet (800 mg total) by mouth every 8 (eight) hours as needed.  . methylcellulose (CITRUCEL) oral powder Take by mouth daily.  Marland Kitchen triamcinolone cream (KENALOG) 0.5 % Apply 1 application topically 2 (two) times daily.  . vitamin B-12 (CYANOCOBALAMIN) 500 MCG tablet Take 500 mcg by mouth daily.  . vitamin C (ASCORBIC ACID) 500 MG tablet Take 500 mg by mouth every morning.   . furosemide (LASIX) 20 MG tablet Take 1 tablet (20 mg total) by mouth daily as needed for edema.   No current facility-administered medications on file prior to visit.      Allergies:  Allergies  Allergen Reactions  . Cabbage Swelling    Red / purple cabbage- Lip swelling  . Voltaren [Diclofenac Sodium] Swelling     Medical History:  Past Medical History:  Diagnosis Date  . Biliary colic   . Cholelithiasis   . Constipation   . Family history of adverse reaction to anesthesia    mother-- hard to wake  . GERD (gastroesophageal reflux disease)   . History of ectopic pregnancy 1984   s/p  right salpingectomy  . History of ovarian cyst 1980s   s/p RSO  . Lower extremity edema   . OA (osteoarthritis)    right knee  . Thyroid goiter    per  pt hx benign bx on the right--- no issues per pt  . Wears glasses    Family history- Reviewed and unchanged Social history- Reviewed and unchanged    Names of Other Physician/Practitioners you currently use: 1. [facility] here for primary care 2. Due for 2020 3. Dentist: Scheduled for 2020  Patient Care Team: Unk Pinto, MD as PCP - General (Internal Medicine)    Screening Tests: Immunization History  Administered Date(s) Administered  . Tdap 01/27/2018    Preventative care: Last colonoscopy: 2016 Last mammogram: 2020 Last pap smear/pelvic exam: 2015, Due. Changing to GYN. DEXA: No risk factors, Due at 65.   Vaccinations: TD or Tdap: 2019  Influenza: 2019 Pneumococcal: N/A Prevnar13: N/A  Shingrix:  DUE discussed with patient to get at local pharmacy.     Review of Systems:  Review of Systems  Constitutional: Negative for chills, diaphoresis, fever, malaise/fatigue and weight loss.  HENT: Negative for congestion, ear discharge, ear pain, hearing loss, nosebleeds, sinus pain, sore throat and tinnitus.   Eyes: Negative for blurred vision, double vision, photophobia, pain, discharge and redness.  Respiratory: Negative for cough, hemoptysis, sputum production, shortness of breath, wheezing and stridor.   Cardiovascular: Negative for chest pain, palpitations, orthopnea, claudication, leg swelling and PND.  Gastrointestinal: Negative for abdominal pain, blood in stool, constipation, diarrhea, heartburn, melena, nausea and vomiting.  Genitourinary: Negative for dysuria, flank pain, frequency, hematuria and urgency.  Musculoskeletal: Positive for joint pain. Negative for back pain, falls, myalgias and neck pain.       Right knee.  Skin: Negative for itching and rash.  Neurological: Negative for dizziness, tingling, tremors, sensory change, speech change, focal weakness, seizures, loss of consciousness, weakness and headaches.   Endo/Heme/Allergies: Negative for environmental allergies and polydipsia. Does not bruise/bleed easily.  Psychiatric/Behavioral: Negative for depression, hallucinations, memory loss, substance abuse and suicidal ideas. The patient is not nervous/anxious and does not have insomnia.       Physical Exam: BP 122/86   Pulse 80   Temp 98.1 F (36.7 C)   Ht 5\' 4"  (1.626 m)   Wt 232 lb 3.2 oz (105.3 kg)   SpO2 98%   BMI 39.86 kg/m  Wt Readings from Last 3 Encounters:  05/21/18 232 lb 3.2 oz (105.3 kg)  01/27/18 230 lb (104.3 kg)  08/31/17 238 lb (108 kg)   General Appearance: Well nourished, in no apparent distress. Eyes: PERRLA, EOMs, conjunctiva no swelling or erythema Sinuses: No Frontal/maxillary tenderness ENT/Mouth: Ext aud canals clear, TMs without erythema, bulging. No erythema, swelling, or exudate on post pharynx.  Tonsils not swollen or erythematous. Hearing normal.  Neck: Supple, thyroid normal.  Respiratory: Respiratory effort normal, BS equal bilaterally without rales, rhonchi, wheezing or stridor.  Cardio: RRR with no MRGs. Brisk peripheral pulses without edema.  Abdomen: Soft, + BS.  Non tender, no guarding, rebound, hernias, masses. Lymphatics: Non tender without lymphadenopathy.  Musculoskeletal: Full ROM, 5/5 strength, normal gait. Right knee, crepitus with ROM.  Tenderness to lateral aspect and mild edema to medial. Skin: Warm, dry without rashes, lesions, ecchymosis.  Neuro: Cranial nerves intact. No cerebellar symptoms.  Psych: Awake and oriented X 3, normal affect, Insight and Judgment appropriate.    Garnet Sierras, NP 10:25 AM [facility]

## 2018-05-21 NOTE — Patient Instructions (Addendum)
We have sent in a prescription for phentermine capsules.  Take once a day in the morning.    Here is the information to transfer to GYN.  Reece Agar, MD  [facility] [address] [place], [place] [address] Call: [phone] Office Hours: [place] - Fri 8:30am - 5:00pm   Contact Ortho for evaluation of your right knee. Try taking Aleve (naproxen) when this flares up. Please let us know if you need a referral for this.    Preventive Care for Adults  A healthy lifestyle and preventive care can promote health and wellness. Preventive health guidelines for women include the following key practices.  A routine yearly physical is a good way to check with your health care provider about your health and preventive screening. It is a chance to share any concerns and updates on your health and to receive a thorough exam.  Visit your dentist for a routine exam and preventive care every 6 months. Brush your teeth twice a day and floss once a day. Good oral hygiene prevents tooth decay and gum disease.  The frequency of eye exams is based on your age, health, family medical history, use of contact lenses, and other factors. Follow your health care provider's recommendations for frequency of eye exams.  Eat a healthy diet. Foods like vegetables, fruits, whole grains, low-fat dairy products, and lean protein foods contain the nutrients you need without too many calories. Decrease your intake of foods high in solid fats, added sugars, and salt. Eat the right amount of calories for you. Get information about a proper diet from your health care provider, if necessary.  Regular physical exercise is one of the most important things you can do for your health. Most adults should get at least 150 minutes of moderate-intensity exercise (any activity that increases your heart rate and causes you to sweat) each week. In addition, most adults need muscle-strengthening  exercises on 2 or more days a week.  Maintain a healthy weight. The body mass index (BMI) is a screening tool to identify possible weight problems. It provides an estimate of body fat based on height and weight. Your health care provider can find your BMI and can help you achieve or maintain a healthy weight. For adults 20 years and older:  A BMI below 18.5 is considered underweight.  A BMI of 18.5 to 24.9 is normal.  A BMI of 25 to 29.9 is considered overweight.  A BMI of 30 and above is considered obese.  Maintain normal blood lipids and cholesterol levels by exercising and minimizing your intake of saturated fat. Eat a balanced diet with plenty of fruit and vegetables. Blood tests for lipids and cholesterol should begin at age 37 and be repeated every 5 years. If your lipid or cholesterol levels are high, you are over 50, or you are at high risk for heart disease, you may need your cholesterol levels checked more frequently. Ongoing high lipid and cholesterol levels should be treated with medicines if diet and exercise are not working.  If you smoke, find out from your health care provider how to quit. If you do not use tobacco, do not start.  Lung cancer screening is recommended for adults aged 80-80 years who are at high risk for developing lung cancer because of a history of smoking. A yearly low-dose CT scan of the lungs is recommended for people who have at least a 30-pack-year history of smoking and are a current smoker  or have quit within the past 15 years. A pack year of smoking is smoking an average of 1 pack of cigarettes a day for 1 year (for example: 1 pack a day for 30 years or 2 packs a day for 15 years). Yearly screening should continue until the smoker has stopped smoking for at least 15 years. Yearly screening should be stopped for people who develop a health problem that would prevent them from having lung cancer treatment.  High blood pressure causes heart disease and  increases the risk of stroke. Your blood pressure should be checked at least every 1 to 2 years. Ongoing high blood pressure should be treated with medicines if weight loss and exercise do not work.  If you are 48-33 years old, ask your health care provider if you should take aspirin to prevent strokes.  Diabetes screening involves taking a blood sample to check your fasting blood sugar level. This should be done once every 3 years, after age 90, if you are within normal weight and without risk factors for diabetes. Testing should be considered at a younger age or be carried out more frequently if you are overweight and have at least 1 risk factor for diabetes.  Breast cancer screening is essential preventive care for women. You should practice "breast self-awareness." This means understanding the normal appearance and feel of your breasts and may include breast self-examination. Any changes detected, no matter how small, should be reported to a health care provider. Women in their 77s and 30s should have a clinical breast exam (CBE) by a health care provider as part of a regular health exam every 1 to 3 years. After age 66, women should have a CBE every year. Starting at age 60, women should consider having a mammogram (breast X-ray test) every year. Women who have a family history of breast cancer should talk to their health care provider about genetic screening. Women at a high risk of breast cancer should talk to their health care providers about having an MRI and a mammogram every year.  Breast cancer gene (BRCA)-related cancer risk assessment is recommended for women who have family members with BRCA-related cancers. BRCA-related cancers include breast, ovarian, tubal, and peritoneal cancers. Having family members with these cancers may be associated with an increased risk for harmful changes (mutations) in the breast cancer genes BRCA1 and BRCA2. Results of the assessment will determine the need for  genetic counseling and BRCA1 and BRCA2 testing.  Routine pelvic exams to screen for cancer are no longer recommended for nonpregnant women who are considered low risk for cancer of the pelvic organs (ovaries, uterus, and vagina) and who do not have symptoms. Ask your health care provider if a screening pelvic exam is right for you.  If you have had past treatment for cervical cancer or a condition that could lead to cancer, you need Pap tests and screening for cancer for at least 20 years after your treatment. If Pap tests have been discontinued, your risk factors (such as having a new sexual partner) need to be reassessed to determine if screening should be resumed. Some women have medical problems that increase the chance of getting cervical cancer. In these cases, your health care provider may recommend more frequent screening and Pap tests.  Colorectal cancer can be detected and often prevented. Most routine colorectal cancer screening begins at the age of 48 years and continues through age 43 years. However, your health care provider may recommend screening at an earlier  age if you have risk factors for colon cancer. On a yearly basis, your health care provider may provide home test kits to check for hidden blood in the stool. Use of a small camera at the end of a tube, to directly examine the colon (sigmoidoscopy or colonoscopy), can detect the earliest forms of colorectal cancer. Talk to your health care provider about this at age 22, when routine screening begins.  Direct exam of the colon should be repeated every 5-10 years through age 29 years, unless early forms of pre-cancerous polyps or small growths are found.  Hepatitis C blood testing is recommended for all people born from [birth year] through 1965 and any individual with known risks for hepatitis C.  Pra  Osteoporosis is a disease in which the bones lose minerals and strength with aging. This can result in serious bone fractures or breaks. The  risk of osteoporosis can be identified using a bone density scan. Women ages 63 years and over and women at risk for fractures or osteoporosis should discuss screening with their health care providers. Ask your health care provider whether you should take a calcium supplement or vitamin D to reduce the rate of osteoporosis.  Menopause can be associated with physical symptoms and risks. Hormone replacement therapy is available to decrease symptoms and risks. You should talk to your health care provider about whether hormone replacement therapy is right for you.  Use sunscreen. Apply sunscreen liberally and repeatedly throughout the day. You should seek shade when your shadow is shorter than you. Protect yourself by wearing long sleeves, pants, a wide-brimmed hat, and sunglasses year round, whenever you are outdoors.  Once a month, do a whole body skin exam, using a mirror to look at the skin on your back. Tell your health care provider of new moles, moles that have irregular borders, moles that are larger than a pencil eraser, or moles that have changed in shape or color.  Stay current with required vaccines (immunizations).  Influenza vaccine. All adults should be immunized every year.  Tetanus, diphtheria, and acellular pertussis (Td, Tdap) vaccine. Pregnant women should receive 1 dose of Tdap vaccine during each pregnancy. The dose should be obtained regardless of the length of time since the last dose. Immunization is preferred during the 27th-36th week of gestation. An adult who has not previously received Tdap or who does not know her vaccine status should receive 1 dose of Tdap. This initial dose should be followed by tetanus and diphtheria toxoids (Td) booster doses every 10 years. Adults with an unknown or incomplete history of completing a 3-dose immunization series with Td-containing vaccines should begin or complete a primary immunization series including a Tdap dose. Adults should receive a  Td booster every 10 years.  Varicella vaccine. An adult without evidence of immunity to varicella should receive 2 doses or a second dose if she has previously received 1 dose. Pregnant females who do not have evidence of immunity should receive the first dose after pregnancy. This first dose should be obtained before leaving the health care facility. The second dose should be obtained 4-8 weeks after the first dose.  Human papillomavirus (HPV) vaccine. Females aged 13-26 years who have not received the vaccine previously should obtain the 3-dose series. The vaccine is not recommended for use in pregnant females. However, pregnancy testing is not needed before receiving a dose. If a female is found to be pregnant after receiving a dose, no treatment is needed. In that case, the remaining  doses should be delayed until after the pregnancy. Immunization is recommended for any person with an immunocompromised condition through the age of 30 years if she did not get any or all doses earlier. During the 3-dose series, the second dose should be obtained 4-8 weeks after the first dose. The third dose should be obtained 24 weeks after the first dose and 16 weeks after the second dose.  Zoster vaccine. One dose is recommended for adults aged 63 years or older unless certain conditions are present.  Measles, mumps, and rubella (MMR) vaccine. Adults born before [birth year] generally are considered immune to measles and mumps. Adults born in [birth year] or later should have 1 or more doses of MMR vaccine unless there is a contraindication to the vaccine or there is laboratory evidence of immunity to each of the three diseases. A routine second dose of MMR vaccine should be obtained at least 28 days after the first dose for students attending postsecondary schools, health care workers, or international travelers. People who received inactivated measles vaccine or an unknown type of measles vaccine during 1963-1967 should receive 2  doses of MMR vaccine. People who received inactivated mumps vaccine or an unknown type of mumps vaccine before 1979 and are at high risk for mumps infection should consider immunization with 2 doses of MMR vaccine. For females of childbearing age, rubella immunity should be determined. If there is no evidence of immunity, females who are not pregnant should be vaccinated. If there is no evidence of immunity, females who are pregnant should delay immunization until after pregnancy. Unvaccinated health care workers born before [birth year] who lack laboratory evidence of measles, mumps, or rubella immunity or laboratory confirmation of disease should consider measles and mumps immunization with 2 doses of MMR vaccine or rubella immunization with 1 dose of MMR vaccine.  Pneumococcal 13-valent conjugate (PCV13) vaccine. When indicated, a person who is uncertain of her immunization history and has no record of immunization should receive the PCV13 vaccine. An adult aged 69 years or older who has certain medical conditions and has not been previously immunized should receive 1 dose of PCV13 vaccine. This PCV13 should be followed with a dose of pneumococcal polysaccharide (PPSV23) vaccine. The PPSV23 vaccine dose should be obtained at least 1 or more year(s) after the dose of PCV13 vaccine. An adult aged 23 years or older who has certain medical conditions and previously received 1 or more doses of PPSV23 vaccine should receive 1 dose of PCV13. The PCV13 vaccine dose should be obtained 1 or more years after the last PPSV23 vaccine dose.    Pneumococcal polysaccharide (PPSV23) vaccine. When PCV13 is also indicated, PCV13 should be obtained first. All adults aged 33 years and older should be immunized. An adult younger than age 78 years who has certain medical conditions should be immunized. Any person who resides in a nursing home or long-term care facility should be immunized. An adult smoker should be immunized. People with  an immunocompromised condition and certain other conditions should receive both PCV13 and PPSV23 vaccines. People with human immunodeficiency virus (HIV) infection should be immunized as soon as possible after diagnosis. Immunization during chemotherapy or radiation therapy should be avoided. Routine use of PPSV23 vaccine is not recommended for American Indians, [place]atives, or people younger than 65 years unless there are medical conditions that require PPSV23 vaccine. When indicated, people who have unknown immunization and have no record of immunization should receive PPSV23 vaccine. One-time revaccination 5 years after the first dose  of PPSV23 is recommended for people aged 19-64 years who have chronic kidney failure, nephrotic syndrome, asplenia, or immunocompromised conditions. People who received 1-2 doses of PPSV23 before age 75 years should receive another dose of PPSV23 vaccine at age 65 years or later if at least 5 years have passed since the previous dose. Doses of PPSV23 are not needed for people immunized with PPSV23 at or after age 46 years.  Preventive Services / Frequency   Ages 8 to 80 years  Blood pressure check.  Lipid and cholesterol check.  Lung cancer screening. / Every year if you are aged 36-80 years and have a 30-pack-year history of smoking and currently smoke or have quit within the past 15 years. Yearly screening is stopped once you have quit smoking for at least 15 years or develop a health problem that would prevent you from having lung cancer treatment.  Clinical breast exam.** / Every year after age 35 years.   BRCA-related cancer risk assessment.** / For women who have family members with a BRCA-related cancer (breast, ovarian, tubal, or peritoneal cancers).  Mammogram.** / Every year beginning at age 71 years and continuing for as long as you are in good health. Consult with your health care provider.  Pap test.** / Every 3 years starting at age 16 years  through age 56 or 70 years with a history of 3 consecutive normal Pap tests.  HPV screening.** / Every 3 years from ages 70 years through ages 46 to 53 years with a history of 3 consecutive normal Pap tests.  Fecal occult blood test (FOBT) of stool. / Every year beginning at age 42 years and continuing until age 37 years. You may not need to do this test if you get a colonoscopy every 10 years.  Flexible sigmoidoscopy or colonoscopy.** / Every 5 years for a flexible sigmoidoscopy or every 10 years for a colonoscopy beginning at age 60 years and continuing until age 33 years.  Hepatitis C blood test.** / For all people born from [birth year] through 1965 and any individual with known risks for hepatitis C.  Skin self-exam. / Monthly.  Influenza vaccine. / Every year.  Tetanus, diphtheria, and acellular pertussis (Tdap/Td) vaccine.** / Consult your health care provider. Pregnant women should receive 1 dose of Tdap vaccine during each pregnancy. 1 dose of Td every 10 years.  Varicella vaccine.** / Consult your health care provider. Pregnant females who do not have evidence of immunity should receive the first dose after pregnancy.  Zoster vaccine.** / 1 dose for adults aged 68 years or older.  Pneumococcal 13-valent conjugate (PCV13) vaccine.** / Consult your health care provider.  Pneumococcal polysaccharide (PPSV23) vaccine.** / 1 to 2 doses if you smoke cigarettes or if you have certain conditions.  Meningococcal vaccine.** / Consult your health care provider.  Hepatitis A vaccine.** / Consult your health care provider.  Hepatitis B vaccine.** / Consult your health care provider. Screening for abdominal aortic aneurysm (AAA)  by ultrasound is recommended for people over 50 who have history of high blood pressure or who are current or former smokers. ++++++++++++++++++ Recommend Adult Low Dose Aspirin or  coated  Aspirin 81 mg daily  To reduce risk of Colon Cancer 20 %,  Skin Cancer 26 % ,   Melanoma 46%  and  Pancreatic cancer 60% +++++++++++++++++++ Vitamin D goal  is between 70-100.  Please make sure that you are taking your Vitamin D as directed.  It is very important as a natural anti-inflammatory  helping hair, skin, and nails, as well as reducing stroke and heart attack risk.  It helps your bones and helps with mood. It also decreases numerous cancer risks so please take it as directed.  Low Vit D is associated with a 200-300% higher risk for CANCER  and 200-300% higher risk for HEART   ATTACK  &  STROKE.   .....................................Marland Kitchen It is also associated with higher death rate at younger ages,  autoimmune diseases like Rheumatoid arthritis, Lupus, Multiple Sclerosis.    Also many other serious conditions, like depression, Alzheimer's Dementia, infertility, muscle aches, fatigue, fibromyalgia - just to name a few. ++++++++++++++++++ Recommend the book "The END of DIETING" by Dr Excell Seltzer  & the book "The END of DIABETES " by Dr Excell Seltzer At [email or web address] - get book & Audio CD's    Being diabetic has a  300% increased risk for heart attack, stroke, cancer, and alzheimer- type vascular dementia. It is very important that you work harder with diet by avoiding all foods that are white. Avoid white rice (brown & wild rice is OK), white potatoes (sweetpotatoes in moderation is OK), White bread or wheat bread or anything made out of white flour like bagels, donuts, rolls, buns, biscuits, cakes, pastries, cookies, pizza crust, and pasta (made from white flour & egg whites) - vegetarian pasta or spinach or wheat pasta is OK. Multigrain breads like Arnold's or Pepperidge [employer], or multigrain sandwich thins or flatbreads.  Diet, exercise and weight loss can reverse and cure diabetes in the early stages.  Diet, exercise and weight loss is very important in the control and prevention of complications of diabetes which affects every system in your body, ie. Brain -  dementia/stroke, eyes - glaucoma/blindness, heart - heart attack/heart failure, kidneys - dialysis, stomach - gastric paralysis, intestines - malabsorption, nerves - severe painful neuritis, circulation - gangrene & loss of a leg(s), and finally cancer and Alzheimers.    I recommend avoid fried & greasy foods,  sweets/candy, white rice (brown or wild rice or Quinoa is OK), white potatoes (sweet potatoes are OK) - anything made from white flour - bagels, doughnuts, rolls, buns, biscuits,white and wheat breads, pizza crust and traditional pasta made of white flour & egg white(vegetarian pasta or spinach or wheat pasta is OK).  Multi-grain bread is OK - like multi-grain flat bread or sandwich thins. Avoid alcohol in excess. Exercise is also important.    Eat all the vegetables you want - avoid meat, especially red meat and dairy - especially cheese.  Cheese is the most concentrated form of trans-fats which is the worst thing to clog up our arteries. Veggie cheese is OK which can be found in the fresh produce section at [employer] or [employer] or [employer]  ++++++++++++++++++++++ DASH Eating Plan  DASH stands for "Dietary Approaches to Stop Hypertension."   The DASH eating plan is a healthy eating plan that has been shown to reduce high blood pressure (hypertension). Additional health benefits may include reducing the risk of type 2 diabetes mellitus, heart disease, and stroke. The DASH eating plan may also help with weight loss. WHAT DO I NEED TO KNOW ABOUT THE DASH EATING PLAN? For the DASH eating plan, you will follow these general guidelines:  Choose foods with a percent daily value for sodium of less than 5% (as listed on the food label).  Use salt-free seasonings or herbs instead of table salt or sea salt.  Check with your health care provider or pharmacist  before using salt substitutes.  Eat lower-sodium products, often labeled as "lower sodium" or "no salt added."  Eat fresh  foods.  Eat more vegetables, fruits, and low-fat dairy products.  Choose whole grains. Look for the word "whole" as the first word in the ingredient list.  Choose fish   Limit sweets, desserts, sugars, and sugary drinks.  Choose heart-healthy fats.  Eat veggie cheese   Eat more home-cooked food and less restaurant, buffet, and fast food.  Limit fried foods.  Cook foods using methods other than frying.  Limit canned vegetables. If you do use them, rinse them well to decrease the sodium.  When eating at a restaurant, ask that your food be prepared with less salt, or no salt if possible.                      WHAT FOODS CAN I EAT? Read Dr Fara Olden Fuhrman's books on The End of Dieting & The End of Diabetes  Grains Whole grain or whole wheat bread. Brown rice. Whole grain or whole wheat pasta. Quinoa, bulgur, and whole grain cereals. Low-sodium cereals. Corn or whole wheat flour tortillas. Whole grain cornbread. Whole grain crackers. Low-sodium crackers.  Vegetables Fresh or frozen vegetables (raw, steamed, roasted, or grilled). Low-sodium or reduced-sodium tomato and vegetable juices. Low-sodium or reduced-sodium tomato sauce and paste. Low-sodium or reduced-sodium canned vegetables.   Fruits All fresh, canned (in natural juice), or frozen fruits.  Protein Products  All fish and seafood.  Dried beans, peas, or lentils. Unsalted nuts and seeds. Unsalted canned beans.  Dairy Low-fat dairy products, such as skim or 1% milk, 2% or reduced-fat cheeses, low-fat ricotta or cottage cheese, or plain low-fat yogurt. Low-sodium or reduced-sodium cheeses.  Fats and Oils Tub margarines without trans fats. Light or reduced-fat mayonnaise and salad dressings (reduced sodium). Avocado. Safflower, olive, or canola oils. Natural peanut or almond butter.  Other Unsalted popcorn and pretzels. The items listed above may not be a complete list of recommended foods or beverages. Contact your  dietitian for more options.  ++++++++++++++++++  WHAT FOODS ARE NOT RECOMMENDED? Grains/ White flour or wheat flour White bread. White pasta. White rice. Refined cornbread. Bagels and croissants. Crackers that contain trans fat.  Vegetables  Creamed or fried vegetables. Vegetables in a . Regular canned vegetables. Regular canned tomato sauce and paste. Regular tomato and vegetable juices.  Fruits Dried fruits. Canned fruit in light or heavy syrup. Fruit juice.  Meat and Other Protein Products Meat in general - RED meat & White meat.  Fatty cuts of meat. Ribs, chicken wings, all processed meats as bacon, sausage, bologna, salami, fatback, hot dogs, bratwurst and packaged luncheon meats.  Dairy Whole or 2% milk, cream, half-and-half, and cream cheese. Whole-fat or sweetened yogurt. Full-fat cheeses or blue cheese. Non-dairy creamers and whipped toppings. Processed cheese, cheese spreads, or cheese curds.  Condiments Onion and garlic salt, seasoned salt, table salt, and sea salt. Canned and packaged gravies. Worcestershire sauce. Tartar sauce. Barbecue sauce. Teriyaki sauce. Soy sauce, including reduced sodium. Steak sauce. Fish sauce. Oyster sauce. Cocktail sauce. Horseradish. Ketchup and mustard. Meat flavorings and tenderizers. Bouillon cubes. Hot sauce. Tabasco sauce. Marinades. Taco seasonings. Relishes.  Fats and Oils Butter, stick margarine, lard, shortening and bacon fat. Coconut, palm kernel, or palm oils. Regular salad dressings.  Pickles and olives. Salted popcorn and pretzels.  The items listed above may not be a complete list of foods and beverages to avoid.

## 2018-05-24 ENCOUNTER — Other Ambulatory Visit: Payer: Self-pay | Admitting: Adult Health Nurse Practitioner

## 2018-05-24 LAB — COMPLETE METABOLIC PANEL WITHOUT GFR
BUN: 13 mg/dL (ref 7–25)
Calcium: 10.6 mg/dL — ABNORMAL HIGH (ref 8.6–10.4)
Chloride: 102 mmol/L (ref 98–110)
GFR, Est African American: 78 mL/min/1.73m2 (ref >=60)
Glucose, Bld: 94 mg/dL (ref 65–99)
Potassium: 4.1 mmol/L (ref 3.5–5.3)
Total Bilirubin: 0.5 mg/dL (ref 0.2–1.2)
Total Protein: 8.1 g/dL (ref 6.1–8.1)

## 2018-05-24 LAB — HEMOGLOBIN A1C
Hgb A1c MFr Bld: 6 %{Hb} — ABNORMAL HIGH (ref <5.7)
Mean Plasma Glucose: 126 (calc)
eAG (mmol/L): 7 (calc)

## 2018-05-24 LAB — COMPLETE METABOLIC PANEL WITH GFR
AG Ratio: 1.3 (calc) (ref 1.0–2.5)
ALT: 13 U/L (ref 6–29)
AST: 15 U/L (ref 10–35)
Albumin: 4.5 g/dL (ref 3.6–5.1)
Alkaline phosphatase (APISO): 100 U/L (ref 37–153)
CO2: 26 mmol/L (ref 20–32)
Creat: 0.94 mg/dL (ref 0.50–1.05)
GFR, Est Non African American: 67 mL/min/{1.73_m2} (ref >=60)
Globulin: 3.6 g/dL (calc) (ref 1.9–3.7)
Sodium: 138 mmol/L (ref 135–146)

## 2018-05-24 LAB — CBC WITH DIFFERENTIAL/PLATELET
Absolute Monocytes: 437 cells/uL (ref 200–950)
Basophils Absolute: 30 cells/uL (ref 0–200)
Basophils Relative: 0.5 %
Eosinophils Absolute: 130 {cells}/uL (ref 15–500)
Eosinophils Relative: 2.2 %
HCT: 39 % (ref 35.0–45.0)
Hemoglobin: 12.8 g/dL (ref 11.7–15.5)
Lymphs Abs: 2543 cells/uL (ref 850–3900)
MCH: 27.1 pg (ref 27.0–33.0)
MCHC: 32.8 g/dL (ref 32.0–36.0)
MCV: 82.6 fL (ref 80.0–100.0)
MPV: 12.2 fL (ref 7.5–12.5)
Monocytes Relative: 7.4 %
Neutro Abs: 2761 {cells}/uL (ref 1500–7800)
Neutrophils Relative %: 46.8 %
Platelets: 285 Thousand/uL (ref 140–400)
RBC: 4.72 10*6/uL (ref 3.80–5.10)
RDW: 12.9 % (ref 11.0–15.0)
Total Lymphocyte: 43.1 %
WBC: 5.9 10*3/uL (ref 3.8–10.8)

## 2018-05-24 LAB — LIPID PANEL
Cholesterol: 204 mg/dL — ABNORMAL HIGH (ref <200)
HDL: 80 mg/dL (ref >=50)
LDL Cholesterol (Calc): 110 mg/dL (calc) — ABNORMAL HIGH
Non-HDL Cholesterol (Calc): 124 mg/dL (calc) (ref <130)
Total CHOL/HDL Ratio: 2.6 (calc) (ref <5.0)
Triglycerides: 56 mg/dL (ref <150)

## 2018-05-24 LAB — INSULIN, RANDOM: Insulin: 5.4 u[IU]/mL (ref 2.0–19.6)

## 2018-05-24 LAB — VITAMIN D 25 HYDROXY (VIT D DEFICIENCY, FRACTURES): Vit D, 25-Hydroxy: 34 ng/mL (ref 30–100)

## 2018-05-25 ENCOUNTER — Other Ambulatory Visit: Payer: Self-pay | Admitting: Adult Health Nurse Practitioner

## 2018-05-25 DIAGNOSIS — Z6841 Body Mass Index (BMI) 40.0 and over, adult: Secondary | ICD-10-CM

## 2018-05-25 DIAGNOSIS — Z6839 Body mass index (BMI) 39.0-39.9, adult: Secondary | ICD-10-CM

## 2018-05-25 MED ORDER — PHENTERMINE HCL 37.5 MG PO CAPS: 37.5000 mg | ORAL_CAPSULE | ORAL | 2 refills | Status: DC

## 2018-08-27 ENCOUNTER — Ambulatory Visit: Payer: Self-pay | Admitting: Adult Health Nurse Practitioner

## 2018-11-04 ENCOUNTER — Telehealth: Payer: Self-pay | Admitting: Physician Assistant

## 2018-11-04 DIAGNOSIS — K2100 Gastro-esophageal reflux disease with esophagitis, without bleeding: Secondary | ICD-10-CM

## 2018-11-04 NOTE — Telephone Encounter (Signed)
Would prefer telephone visit too since she has not been seen since Feb and has canceled several appointments but will put in referral too. Suggest phone visit tomorrow Please go to the ER if you have any severe AB pain, unable to hold down food/water, blood in stool or vomit, chest pain, shortness of breath, or any worsening symptoms.

## 2018-11-04 NOTE — Telephone Encounter (Signed)
Advised the patient Dr Vicki Shea is not accepting new patients, per his office. scheduled patient ov w/ our office televisit 11-05-18. Sent urgent referral to [facility] GI per patient request. Go to ED if GI symptoms worsen

## 2018-11-04 NOTE — Progress Notes (Signed)
Virtual Visit via Telephone Note  I connected with Vicki Shea on 11/04/18 at  9:00 AM EDT by telephone and verified that I am speaking with the correct person using two identifiers.  Location: Patient: home Provider: [facility] office   I discussed the limitations, risks, security and privacy concerns of performing an evaluation and management service by telephone and the availability of in person appointments. I also discussed with the patient that there may be a patient responsible charge related to this service. The patient expressed understanding and agreed to proceed.   History of Present Illness:  There were no vitals taken for this visit.  58 y.o. female with hx of GERD reports she started having increased reflux, epigastric/retrosternal burning after eating, and sensation of "pill stuck in chest" when she eats. She also endorses bloating, full sensation in stomach. She reports this started back in May, gradual onset but worse around father's day.   She reports she adjusted her diet, eating less fast food, drinking water only, was taking pepto-bismol and was feeling somewhat improved for a few weeks taking this regularly, also started taking omeprazole 20 mg 24 hour release but was only taking occasionally and wasn't finding much relief other than this week having less bloating.   She reports pain has improved in chest but now having a sensation of something stuck in her throat,  Hoarseness, some coughing.   She reports increased discomfort when lying back at night, has been sleeping sitting up in a recliner  Alcohol - was drinking wine, but tapered off in the last week, was drinking 1 glass of wine 3-4 days a week after work, typical intake for her  NSAIDs - she is prescribed ibuprofen but reports she is not taking recently, was d/c'd by cardiology   She has been referred urgently to [facility] as previously established Dr. Earlean Shawl was not able to take on any new patients at this  time; pending an appointment with Dr. Earle Gell  Allergies:  Allergies  Allergen Reactions  . Cabbage Swelling    Red / purple cabbage- Lip swelling  . Voltaren [Diclofenac Sodium] Swelling   Medical History:  has Morbid obesity ([place]); Hyperlipidemia; Vitamin D deficiency; and Prediabetes on their problem list. Surgical History:  She  has a past surgical history that includes Knee arthroscopy (Right, 2010); Colonoscopy (10-26-2014  dr Earlean Shawl); Ectopic pregnancy surgery (1990s); Right oophorectomy (1980s); and Cholecystectomy (N/A, 12/17/2016). Family History:  Herfamily history includes Atrial fibrillation in her mother; Hypertension in her brother, brother, and mother; Kidney disease in her mother; Stroke in her brother. Social History:   reports that she quit smoking about 30 years ago. Her smoking use included cigarettes. She quit after 5.00 years of use. She has never used smokeless tobacco. She reports current alcohol use. She reports that she does not use drugs.    Observations/Objective:  General : patient sounds uncomfortable though in no acute distress HEENT: no significant hoarseness, no cough for duration of visit Lungs: speaks in complete sentences, no audible wheezing, no apparent distress Neurological: alert, oriented x 3 Psychiatric: pleasant, judgement appropriate    Assessment and Plan:  Diagnoses and all orders for this visit:  GERD with esophagitis Initiated medication for new reflux related symptoms:  Discussed diet, avoiding triggers and other lifestyle changes, can try putting blocks under head of bed to tilt up She is pending urgent referral to [facility] Dr. Earle Gell, anticipate will be getting EGD; she will call back if hasn't  heard from their office by early next week  Please go to the ER if you have any severe AB pain, unable to hold down food/water, blood in stool or vomit, chest pain, shortness of breath, or any worsening symptoms.  -      omeprazole (PRILOSEC) 20 MG capsule; Take 1 capsule (20 mg total) by mouth 2 (two) times daily before a meal. -     sucralfate (CARAFATE) 1 g tablet; Take 1 tablet (1 g total) by mouth 4 (four) times daily -  before meals and at bedtime. Dissolve in 2-4 oz of water prior to drinking.   Follow Up Instructions:    I discussed the assessment and treatment plan with the patient. The patient was provided an opportunity to ask questions and all were answered. The patient agreed with the plan and demonstrated an understanding of the instructions.   The patient was advised to call back or seek an in-person evaluation if the symptoms worsen or if the condition fails to improve as anticipated.  I provided 20 minutes of non-face-to-face time during this encounter.   Izora Ribas, NP  Future Appointments  Date Time Provider [place]  02/09/2019 10:00 AM [name], PA-C [id number] None

## 2018-11-04 NOTE — Addendum Note (Signed)
Addended by: [name] R on: 11/04/2018 01:02 PM   Modules accepted: Orders

## 2018-11-04 NOTE — Telephone Encounter (Signed)
Patient called, requests Urgent referral to Dr Earlie Raveling- since 15mths c/o stinging burning in middle chest; above sternum,  and in throat. Sensation of food getting stuck in throat. heart burn, indejestion, Gaseous but unable to burp, abdominal distintion. OTC Prilosec and pepto no helping. Patient states spicy food is worsens. Sleeps inclined to prevent burning in throat when she tries to lay flat. No jaw pain, no back pain, no radiating pains, no arm pains. States he had gallbladder surgery in 2018.  She called Dr Earlean Shawl, they suggested she call us for referral- She is preferring referral only-  her insurance has changed- now just [employer]-  Please advise your recommendation.

## 2018-11-05 ENCOUNTER — Ambulatory Visit: Admitting: Adult Health

## 2018-11-05 ENCOUNTER — Encounter: Payer: Self-pay | Admitting: Adult Health

## 2018-11-05 ENCOUNTER — Other Ambulatory Visit: Payer: Self-pay

## 2018-11-05 DIAGNOSIS — K2100 Gastro-esophageal reflux disease with esophagitis, without bleeding: Secondary | ICD-10-CM | POA: Insufficient documentation

## 2018-11-05 MED ORDER — SUCRALFATE 1 G PO TABS: 1.0000 g | ORAL_TABLET | Freq: Three times a day (TID) | ORAL | 1 refills | Status: DC

## 2018-11-05 MED ORDER — OMEPRAZOLE 20 MG PO CPDR: 20.0000 mg | DELAYED_RELEASE_CAPSULE | Freq: Two times a day (BID) | ORAL | 1 refills | Status: AC

## 2019-01-07 ENCOUNTER — Encounter: Payer: Self-pay | Admitting: Physician Assistant

## 2019-02-07 ENCOUNTER — Encounter: Payer: Self-pay | Admitting: Gastroenterology

## 2019-02-09 ENCOUNTER — Encounter: Payer: Self-pay | Admitting: Physician Assistant

## 2019-05-19 ENCOUNTER — Telehealth: Payer: Self-pay | Admitting: Physician Assistant

## 2019-05-19 DIAGNOSIS — N6320 Unspecified lump in the left breast, unspecified quadrant: Secondary | ICD-10-CM

## 2019-05-19 NOTE — Telephone Encounter (Signed)
Patient called with almond size mass, we will schedule the patient for a follow up in the office since it has been x 10/2018 and she does not have a follow up but I will go ahead and put in the order.

## 2019-05-19 NOTE — Addendum Note (Signed)
Addended by: [name] R on: 05/19/2019 01:38 PM   Modules accepted: Orders

## 2019-05-19 NOTE — Telephone Encounter (Signed)
Patient called to request diagnostic tomo bilateral mammogram & Lft [place]. Called to schedule screening mammogram at [facility]- patient indicated issue @ left breast at 9 oclock - almond size mass. Patient was advised to contact pcp and notify. Please order [id number] & [id number] to [facility] [place].

## 2019-06-01 ENCOUNTER — Ambulatory Visit
Admission: RE | Admit: 2019-06-01 | Discharge: 2019-06-01 | Disposition: A | Discharge status: Home / Self Care | Source: Ambulatory Visit | Attending: Physician Assistant | Admitting: Physician Assistant

## 2019-06-01 ENCOUNTER — Other Ambulatory Visit: Payer: Self-pay

## 2019-06-01 ENCOUNTER — Ambulatory Visit
Admission: RE | Admit: 2019-06-01 | Discharge: 2019-06-01 | Disposition: A | Discharge status: Home / Self Care | Payer: PRIVATE HEALTH INSURANCE | Source: Ambulatory Visit | Attending: Physician Assistant | Admitting: Physician Assistant

## 2019-06-01 ENCOUNTER — Other Ambulatory Visit: Payer: Self-pay | Admitting: Physician Assistant

## 2019-06-01 DIAGNOSIS — N6320 Unspecified lump in the left breast, unspecified quadrant: Secondary | ICD-10-CM

## 2019-06-08 ENCOUNTER — Ambulatory Visit
Admission: RE | Admit: 2019-06-08 | Discharge: 2019-06-08 | Disposition: A | Discharge status: Home / Self Care | Payer: PRIVATE HEALTH INSURANCE | Source: Ambulatory Visit | Attending: Physician Assistant | Admitting: Physician Assistant

## 2019-06-08 ENCOUNTER — Other Ambulatory Visit: Payer: Self-pay

## 2019-06-08 DIAGNOSIS — N6320 Unspecified lump in the left breast, unspecified quadrant: Secondary | ICD-10-CM

## 2020-02-10 ENCOUNTER — Encounter: Admitting: Physician Assistant

## 2020-02-23 ENCOUNTER — Other Ambulatory Visit: Payer: Self-pay | Admitting: Internal Medicine

## 2020-02-23 ENCOUNTER — Other Ambulatory Visit: Payer: Self-pay | Admitting: Surgery

## 2020-02-23 DIAGNOSIS — N644 Mastodynia: Secondary | ICD-10-CM

## 2020-02-23 DIAGNOSIS — Z9889 Other specified postprocedural states: Secondary | ICD-10-CM

## 2020-02-23 DIAGNOSIS — Z853 Personal history of malignant neoplasm of breast: Secondary | ICD-10-CM

## 2020-02-24 ENCOUNTER — Other Ambulatory Visit: Payer: Self-pay | Admitting: Surgery

## 2020-02-24 ENCOUNTER — Ambulatory Visit
Admission: RE | Admit: 2020-02-24 | Discharge: 2020-02-24 | Disposition: A | Discharge status: Home / Self Care | Payer: PRIVATE HEALTH INSURANCE | Source: Ambulatory Visit | Attending: Surgery | Admitting: Surgery

## 2020-02-24 DIAGNOSIS — Z853 Personal history of malignant neoplasm of breast: Secondary | ICD-10-CM

## 2020-02-24 DIAGNOSIS — Z9889 Other specified postprocedural states: Secondary | ICD-10-CM

## 2020-02-24 DIAGNOSIS — N644 Mastodynia: Secondary | ICD-10-CM

## 2021-09-10 ENCOUNTER — Encounter: Payer: Self-pay | Admitting: Nurse Practitioner

## 2021-09-10 ENCOUNTER — Telehealth: Payer: Self-pay | Admitting: Nurse Practitioner

## 2021-09-10 ENCOUNTER — Ambulatory Visit (INDEPENDENT_AMBULATORY_CARE_PROVIDER_SITE_OTHER): Admitting: Nurse Practitioner

## 2021-09-10 VITALS — BP 150/88 | HR 95 | Temp 97.3°F | Wt 274.8 lb

## 2021-09-10 DIAGNOSIS — F321 Major depressive disorder, single episode, moderate: Secondary | ICD-10-CM

## 2021-09-10 DIAGNOSIS — I10 Essential (primary) hypertension: Secondary | ICD-10-CM

## 2021-09-10 DIAGNOSIS — E782 Mixed hyperlipidemia: Secondary | ICD-10-CM | POA: Diagnosis not present

## 2021-09-10 DIAGNOSIS — R7309 Other abnormal glucose: Secondary | ICD-10-CM | POA: Diagnosis not present

## 2021-09-10 DIAGNOSIS — C50812 Malignant neoplasm of overlapping sites of left female breast: Secondary | ICD-10-CM

## 2021-09-10 DIAGNOSIS — M25561 Pain in right knee: Secondary | ICD-10-CM

## 2021-09-10 MED ORDER — HYDROCHLOROTHIAZIDE 25 MG PO TABS: 25.0000 mg | ORAL_TABLET | Freq: Every day | ORAL | 3 refills | Status: AC

## 2021-09-10 MED ORDER — TRULICITY 0.75 MG/0.5ML ~~LOC~~ SOAJ: 0.7500 mg | SUBCUTANEOUS | 3 refills | Status: DC

## 2021-09-10 NOTE — Telephone Encounter (Signed)
Pt said [facility] wont accept tricare for her prescription wanting the Trulicity to be sent to [facility] in [place] please.

## 2021-09-10 NOTE — Patient Instructions (Signed)
Inject Trulicity(Dulaglutide) once a week.  Keep in refrigerator until use Follow up in 4 weeks to evaluate  Dulaglutide Injection What is this medication? DULAGLUTIDE (DOO la GLOO tide) treats type 2 diabetes. It works by increasing insulin levels in your body, which decreases your blood sugar (glucose). It also reduces the amount of sugar released into your blood and slows down your digestion. It can also be used to lower the risk of heart attack and stroke in people with type 2 diabetes. Changes to diet and exercise are often combined with this medication. This medicine may be used for other purposes; ask your health care provider or pharmacist if you have questions. COMMON BRAND NAME(S): Trulicity What should I tell my care team before I take this medication? They need to know if you have any of these conditions: Endocrine tumors (MEN 2) or if someone in your family had these tumors Eye disease, vision problems History of pancreatitis Kidney disease Liver disease Stomach or intestine problems Thyroid cancer or if someone in your family had thyroid cancer An unusual or allergic reaction to dulaglutide, other medications, foods, dyes, or preservatives Pregnant or trying to get pregnant Breast-feeding How should I use this medication? This medication is injected under the skin. You will be taught how to prepare and give it. Take it as directed on the prescription label on the same day of each week. Do NOT prime the pen. Keep taking it unless your care team tells you to stop. If you use this medication with insulin, you should inject this medication and the insulin separately. Do not mix them together. Do not give the injections right next to each other. Change (rotate) injection sites with each injection. This medication comes with INSTRUCTIONS FOR USE. Ask your pharmacist for directions on how to use this medication. Read the information carefully. Talk to your pharmacist or care team if you  have questions. It is important that you put your used needles and syringes in a special sharps container. Do not put them in a trash can. If you do not have a sharps container, call your pharmacist or care team to get one. A special MedGuide will be given to you by the pharmacist with each prescription and refill. Be sure to read this information carefully each time. Talk to your care team about the use of this medication in children. While it may be prescribed for children as young as 10 years for selected conditions, precautions do apply. Overdosage: If you think you have taken too much of this medicine contact a poison control center or emergency room at once. NOTE: This medicine is only for you. Do not share this medicine with others. What if I miss a dose? If you miss a dose, take it as soon as you can unless it is more than 3 days late. If it is more than 3 days late, skip the missed dose. Take the next dose at the normal time. What may interact with this medication? Other medications for diabetes Many medications may cause changes in blood sugar, these include: Alcohol containing beverages Antiviral medications for HIV or AIDS Aspirin and aspirin-like medications Certain medications for blood pressure, heart disease, irregular heart beat Chromium Diuretics Female hormones, such as estrogens or progestins, birth control pills Fenofibrate Gemfibrozil Isoniazid Lanreotide Female hormones or anabolic steroids MAOIs like Carbex, Eldepryl, Marplan, Nardil, and Parnate Medications for allergies, asthma, cold, or cough Medications for depression, anxiety, or psychotic disturbances Medications for weight loss Niacin Nicotine NSAIDs, medications  for pain and inflammation, like ibuprofen or naproxen Octreotide Pasireotide Pentamidine Phenytoin Probenecid Quinolone antibiotics such as ciprofloxacin, levofloxacin, ofloxacin Some herbal dietary supplements Steroid medications such as  prednisone or cortisone Sulfamethoxazole; trimethoprim Thyroid hormones Some medications can hide the warning symptoms of low blood sugar (hypoglycemia). You may need to monitor your blood sugar more closely if you are taking one of these medications. These include: Beta-blockers, often used for high blood pressure or heart problems (examples include atenolol, metoprolol, propranolol) Clonidine Guanethidine Reserpine This list may not describe all possible interactions. Give your health care provider a list of all the medicines, herbs, non-prescription drugs, or dietary supplements you use. Also tell them if you smoke, drink alcohol, or use illegal drugs. Some items may interact with your medicine. What should I watch for while using this medication? Visit your care team for regular checks on your progress. Check with your care team if you have severe diarrhea, nausea, and vomiting, or if you sweat a lot. The loss of too much body fluid may make it dangerous for you to take this medication. A test called the HbA1C (A1C) will be monitored. This is a simple blood test. It measures your blood sugar control over the last 2 to 3 months. You will receive this test every 3 to 6 months. Learn how to check your blood sugar. Learn the symptoms of low and high blood sugar and how to manage them. Always carry a quick-source of sugar with you in case you have symptoms of low blood sugar. Examples include hard sugar candy or glucose tablets. Make sure others know that you can choke if you eat or drink when you develop serious symptoms of low blood sugar, such as seizures or unconsciousness. Get medical help at once. Tell your care team if you have high blood sugar. You might need to change the dose of your medication. If you are sick or exercising more than usual, you may need to change the dose of your medication. Do not skip meals. Ask your care team if you should avoid alcohol. Many nonprescription cough and  cold products contain sugar or alcohol. These can affect blood sugar. Pens should never be shared. Even if the needle is changed, sharing may result in passing of viruses like hepatitis or HIV. Wear a medical ID bracelet or chain. Carry a card that describes your condition. List the medications and doses you take on the card. What side effects may I notice from receiving this medication? Side effects that you should report to your care team as soon as possible: Allergic reactions--skin rash, itching, hives, swelling of the face, lips, tongue, or throat Change in vision Dehydration--increased thirst, dry mouth, feeling faint or lightheaded, headache, dark yellow or brown urine Kidney injury--decrease in the amount of urine, swelling of the ankles, hands, or feet Pancreatitis--severe stomach pain that spreads to your back or gets worse after eating or when touched, fever, nausea, vomiting Thyroid cancer--new mass or lump in the neck, pain or trouble swallowing, trouble breathing, hoarseness Side effects that usually do not require medical attention (report to your care team if they continue or are bothersome): Diarrhea Loss of appetite Nausea Stomach pain Vomiting This list may not describe all possible side effects. Call your doctor for medical advice about side effects. You may report side effects to FDA at 1-800-FDA-1088. Where should I keep my medication? Keep out of the reach of children and pets. Refrigeration (preferred): Store unopened pens in a refrigerator between 2 and  8 degrees C (36 and 46 degrees F). Keep it in the original carton until you are ready to take it. Do not freeze or use if the medication has been frozen. Protect from light. Get rid of any unused medication after the expiration date on the label. Room Temperature: The pen may be stored at room temperature below 30 degrees C (86 degrees F) for up to a total of 14 days if needed. Protect from light. Avoid exposure to  extreme heat. If it is stored at room temperature, throw away any unused medication after 14 days or after it expires, whichever is first. To get rid of medications that are no longer needed or have expired: Take the medication to a medication take-back program. Check with your pharmacy or law enforcement to find a location. If you cannot return the medication, ask your pharmacist or care team how to get rid of this medication safely. NOTE: This sheet is a summary. It may not cover all possible information. If you have questions about this medicine, talk to your doctor, pharmacist, or health care provider.  2023 Elsevier/Gold Standard (2021-03-05 00:00:00)

## 2021-09-10 NOTE — Progress Notes (Signed)
Assessment and Plan: Vernita was seen today for acute visit.  Diagnoses and all orders for this visit:  Mixed hyperlipidemia Continue iet and exercise, if elevated may need to consider adding a statin -     CBC with Differential/Platelet -     COMPLETE METABOLIC PANEL WITH GFR -     Lipid panel -     TSH  Abnormal glucose Start Trulicity Continue diet and exercise.  Perform daily foot/skin check, notify office of any concerning changes.  Check A1C  -     TSH -     Hemoglobin A1c -     Dulaglutide (TRULICITY) 7.41 SE/3.9RV SOPN; Inject 0.75 mg into the skin once a week.  Essential hypertension Currently not on medication. Start HCTZ 25 mg QD. Check BP 3 times a week and keep log, bring to next appointment in 4 weeks - DASH diet, exercise and monitor at home. Call if greater than 130/80.  Go to the ER if any chest pain, shortness of breath, nausea, dizziness, severe HA, changes vision/speech    Morbid obesity with BMI of 45.0-49.9, adult (HCC) Long time is spent counseling on diet and exercise Will focus on limiting saturated fats and simple carbs.  Will begin increasing activity once right knee feels better -     TSH  Malignant neoplasm of overlapping sites of left breast ([place]) Continue medication and follow with Dr. Lindon Romp  Depression Long discussion regarding mood and treatment options Denies thoughts of hurting self or others Strongly encouraged counseling Would like to wait until next visit to start any medication Dicussed use of Lexapro Instructed patient to contact office or on-call physician promptly should condition worsen or any new symptoms appear. IF THE PATIENT HAS ANY SUICIDAL OR HOMICIDAL IDEATIONS, CALL THE OFFICE, DISCUSS WITH A SUPPORT MEMBER, OR GO TO THE ER IMMEDIATELY. Patient was agreeable with this plan.    Right knee Pain Continue to follow with orthopedics  Further disposition pending results of labs. Discussed med's effects and SE's.   Over  30 minutes of exam, counseling, chart review, and critical decision making was performed.   No future appointments.   ------------------------------------------------------------------------------------------------------------------   HPI BP (!) 150/88   Pulse 95   Temp (!) 97.3 F (36.3 C)   Wt 274 lb 12.8 oz (124.6 kg)   SpO2 97%   BMI 47.17 kg/m   61 y.o.female presents for evaluation of blood sugar, blood pressure and cholesterol  She has a history of left breast cancer treated with lumpectomy/radiation(2021) and on Anastrazole- followed by Dr. Lindon Romp Malignant neoplasm of overlapping sites of left female breast 06/15/2019  Cancer Staging:    Clinical stage from 07/13/2019: Stage IB (cT2, cN0, cM0, G2, ER+, PR+, HER2-)    She was seen at orthopedics, Dr. Eden Lathe for right knee pain today- received a cortisone injection. He does want patient to lose at least 50 pounds.   BMI is Body mass index is 47.17 kg/m., she has not been working on diet and exercise.  Wt Readings from Last 3 Encounters:  09/10/21 274 lb 12.8 oz (124.6 kg)  05/21/18 232 lb 3.2 oz (105.3 kg)  01/27/18 230 lb (104.3 kg)   She stopped eating well once she was diagnosed with cancer, daughter also died of cervical at that same time at age 34. She continues to have some depressive thoughts. Has switched jobs, last job had a lot of stress. She does also take care of her mother.  Her usual counseling source, her  pastor, just passed away this week  Past Medical History:  Diagnosis Date   Biliary colic    Cholelithiasis    Constipation    Family history of adverse reaction to anesthesia    mother-- hard to wake   GERD (gastroesophageal reflux disease)    History of ectopic pregnancy 1984   s/p  right salpingectomy   History of ovarian cyst 1980s   s/p RSO   Lower extremity edema    OA (osteoarthritis)    right knee   Thyroid goiter    per pt hx benign bx on the right--- no issues per pt   Wears  glasses      Allergies  Allergen Reactions   Cabbage Swelling    Red / purple cabbage- Lip swelling   Voltaren [Diclofenac Sodium] Swelling    Current Outpatient Medications on File Prior to Visit  Medication Sig   anastrozole (ARIMIDEX) 1 MG tablet Take 1 mg by mouth daily.   Biotin 5000 MCG TABS Take by mouth.   Cholecalciferol (VITAMIN D3) 5000 units CAPS Take 1 capsule by mouth every morning.   Ferrous Sulfate (IRON PO) Take by mouth. M, W, F   methylcellulose oral powder Take by mouth daily.   omeprazole (PRILOSEC) 20 MG capsule Take 1 capsule (20 mg total) by mouth 2 (two) times daily before a meal.   vitamin B-12 (CYANOCOBALAMIN) 500 MCG tablet Take 500 mcg by mouth daily.   furosemide (LASIX) 20 MG tablet Take 1 tablet (20 mg total) by mouth daily as needed for edema.   phentermine 37.5 MG capsule Take 1 capsule (37.5 mg total) by mouth every morning. (Patient not taking: Reported on 09/10/2021)   sucralfate (CARAFATE) 1 g tablet Take 1 tablet (1 g total) by mouth 4 (four) times daily -  before meals and at bedtime. Dissolve in 2-4 oz of water prior to drinking.   triamcinolone cream (KENALOG) 0.5 % Apply 1 application topically 2 (two) times daily. (Patient not taking: Reported on 09/10/2021)   vitamin C (ASCORBIC ACID) 500 MG tablet Take 500 mg by mouth every morning.  (Patient not taking: Reported on 09/10/2021)   No current facility-administered medications on file prior to visit.    Review of Systems  Constitutional:  Negative for chills, fever and weight loss.       Weight gain  HENT:  Negative for congestion and hearing loss.   Eyes:  Negative for blurred vision and double vision.  Respiratory:  Negative for cough and shortness of breath.   Cardiovascular:  Negative for chest pain, palpitations, orthopnea and leg swelling.  Gastrointestinal:  Negative for abdominal pain, constipation, diarrhea, heartburn, nausea and vomiting.  Musculoskeletal:  Positive for joint pain  (right knee). Negative for falls and myalgias.  Skin:  Negative for rash.  Neurological:  Negative for dizziness, tingling, tremors, loss of consciousness and headaches.  Psychiatric/Behavioral:  Positive for depression. Negative for memory loss and suicidal ideas.   .   Physical Exam:  BP (!) 150/88   Pulse 95   Temp (!) 97.3 F (36.3 C)   Wt 274 lb 12.8 oz (124.6 kg)   SpO2 97%   BMI 47.17 kg/m   General Appearance: Morbidly obese, pleasant female, in no apparent distress. Eyes: PERRLA, EOMs, conjunctiva no swelling or erythema Sinuses: No Frontal/maxillary tenderness ENT/Mouth: Ext aud canals clear, TMs without erythema, bulging. No erythema, swelling, or exudate on post pharynx.  Tonsils not swollen or erythematous. Hearing normal.  Neck: Supple,  thyroid normal.  Respiratory: Respiratory effort normal, BS equal bilaterally without rales, rhonchi, wheezing or stridor.  Cardio: RRR with no MRGs. Brisk peripheral pulses without edema.  Abdomen: Soft, + BS.  Non tender, no guarding, rebound, hernias, masses. Lymphatics: Non tender without lymphadenopathy.  Musculoskeletal: Full ROM, mildly antalgic gait. Swelling and pain lateral aspect of right knee Skin: Warm, dry without rashes, lesions, ecchymosis.  Neuro: Cranial nerves intact. Normal muscle tone, no cerebellar symptoms. Sensation intact.  Psych: Awake and oriented X 3, normal affect, Insight and Judgment appropriate.     Magda Bernheim, NP 3:36 PM [facility]

## 2021-09-11 ENCOUNTER — Other Ambulatory Visit: Payer: Self-pay

## 2021-09-11 DIAGNOSIS — R7309 Other abnormal glucose: Secondary | ICD-10-CM

## 2021-09-11 LAB — CBC WITH DIFFERENTIAL/PLATELET
Absolute Monocytes: 614 cells/uL (ref 200–950)
Basophils Absolute: 37 cells/uL (ref 0–200)
Basophils Relative: 0.5 %
Eosinophils Absolute: 118 cells/uL (ref 15–500)
Eosinophils Relative: 1.6 %
HCT: 37.1 % (ref 35.0–45.0)
Hemoglobin: 12.1 g/dL (ref 11.7–15.5)
Lymphs Abs: 1909 cells/uL (ref 850–3900)
MCH: 27 pg (ref 27.0–33.0)
MCHC: 32.6 g/dL (ref 32.0–36.0)
MCV: 82.8 fL (ref 80.0–100.0)
MPV: 12.1 fL (ref 7.5–12.5)
Monocytes Relative: 8.3 %
Neutro Abs: 4721 cells/uL (ref 1500–7800)
Neutrophils Relative %: 63.8 %
Platelets: 305 10*3/uL (ref 140–400)
RBC: 4.48 10*6/uL (ref 3.80–5.10)
RDW: 13 % (ref 11.0–15.0)
Total Lymphocyte: 25.8 %
WBC: 7.4 10*3/uL (ref 3.8–10.8)

## 2021-09-11 LAB — LIPID PANEL
Cholesterol: 216 mg/dL — ABNORMAL HIGH (ref <200)
HDL: 77 mg/dL (ref >=50)
LDL Cholesterol (Calc): 121 mg/dL (calc) — ABNORMAL HIGH
Non-HDL Cholesterol (Calc): 139 mg/dL (calc) — ABNORMAL HIGH (ref <130)
Total CHOL/HDL Ratio: 2.8 (calc) (ref <5.0)
Triglycerides: 83 mg/dL (ref <150)

## 2021-09-11 LAB — COMPLETE METABOLIC PANEL WITH GFR
AG Ratio: 1.3 (calc) (ref 1.0–2.5)
ALT: 20 U/L (ref 6–29)
AST: 18 U/L (ref 10–35)
Albumin: 4.6 g/dL (ref 3.6–5.1)
Alkaline phosphatase (APISO): 94 U/L (ref 37–153)
BUN/Creatinine Ratio: 11 (calc) (ref 6–22)
BUN: 12 mg/dL (ref 7–25)
CO2: 26 mmol/L (ref 20–32)
Calcium: 10.7 mg/dL — ABNORMAL HIGH (ref 8.6–10.4)
Chloride: 103 mmol/L (ref 98–110)
Creat: 1.08 mg/dL — ABNORMAL HIGH (ref 0.50–1.05)
Globulin: 3.5 g/dL (calc) (ref 1.9–3.7)
Glucose, Bld: 87 mg/dL (ref 65–99)
Potassium: 4.2 mmol/L (ref 3.5–5.3)
Sodium: 141 mmol/L (ref 135–146)
Total Bilirubin: 0.4 mg/dL (ref 0.2–1.2)
Total Protein: 8.1 g/dL (ref 6.1–8.1)
eGFR: 59 mL/min/{1.73_m2} — ABNORMAL LOW (ref >=60)

## 2021-09-11 LAB — HEMOGLOBIN A1C
Hgb A1c MFr Bld: 6.3 % of total Hgb — ABNORMAL HIGH (ref <5.7)
Mean Plasma Glucose: 134 mg/dL
eAG (mmol/L): 7.4 mmol/L

## 2021-09-11 LAB — TSH: TSH: 1.03 mIU/L (ref 0.40–4.50)

## 2021-09-11 MED ORDER — TRULICITY 0.75 MG/0.5ML ~~LOC~~ SOAJ: 0.7500 mg | SUBCUTANEOUS | 3 refills | Status: DC

## 2021-09-12 ENCOUNTER — Encounter: Payer: Self-pay | Admitting: Nurse Practitioner

## 2021-09-12 ENCOUNTER — Telehealth: Payer: Self-pay

## 2021-09-12 ENCOUNTER — Telehealth: Payer: Self-pay | Admitting: Nurse Practitioner

## 2021-09-12 ENCOUNTER — Other Ambulatory Visit: Payer: Self-pay | Admitting: Nurse Practitioner

## 2021-09-12 DIAGNOSIS — E119 Type 2 diabetes mellitus without complications: Secondary | ICD-10-CM | POA: Insufficient documentation

## 2021-09-12 DIAGNOSIS — R7309 Other abnormal glucose: Secondary | ICD-10-CM

## 2021-09-12 MED ORDER — SEMAGLUTIDE(0.25 OR 0.5MG/DOS) 2 MG/3ML ~~LOC~~ SOPN: 0.2500 mg | PEN_INJECTOR | SUBCUTANEOUS | 1 refills | Status: DC

## 2021-09-12 NOTE — Telephone Encounter (Signed)
Patient called and says that Ozempic is covered under [employer] as long as the [facility] is [facility].

## 2021-09-12 NOTE — Telephone Encounter (Signed)
Prior auth for Trulicity denied.

## 2021-09-12 NOTE — Telephone Encounter (Signed)
I d/c script for Trulicity and sent script for [facility]

## 2021-09-17 ENCOUNTER — Other Ambulatory Visit: Payer: Self-pay | Admitting: Nurse Practitioner

## 2021-09-17 ENCOUNTER — Telehealth: Payer: Self-pay

## 2021-09-17 DIAGNOSIS — R7309 Other abnormal glucose: Secondary | ICD-10-CM

## 2021-09-17 MED ORDER — METFORMIN HCL 500 MG PO TABS: 500.0000 mg | ORAL_TABLET | Freq: Two times a day (BID) | ORAL | 11 refills | Status: DC

## 2021-09-17 NOTE — Progress Notes (Signed)
Unable to get Ozempic filled , will start Metformin

## 2021-09-17 NOTE — Telephone Encounter (Signed)
Prior auth for Ozempic denied. Patient aware and is willing to try the Metformin.

## 2021-10-01 ENCOUNTER — Encounter: Payer: Self-pay | Admitting: Nurse Practitioner

## 2021-10-01 NOTE — Telephone Encounter (Signed)
Can you let me know what her prior auth said again?

## 2021-10-02 ENCOUNTER — Other Ambulatory Visit: Payer: Self-pay | Admitting: Nurse Practitioner

## 2021-10-02 DIAGNOSIS — R7309 Other abnormal glucose: Secondary | ICD-10-CM

## 2021-10-02 MED ORDER — TRULICITY 0.75 MG/0.5ML ~~LOC~~ SOAJ: 0.7500 mg | SUBCUTANEOUS | 3 refills | Status: DC

## 2021-10-10 NOTE — Telephone Encounter (Signed)
Can you give pt the information on the denial

## 2021-10-31 ENCOUNTER — Other Ambulatory Visit: Payer: Self-pay

## 2021-10-31 DIAGNOSIS — R7309 Other abnormal glucose: Secondary | ICD-10-CM

## 2021-10-31 MED ORDER — TRULICITY 0.75 MG/0.5ML ~~LOC~~ SOAJ: 0.7500 mg | SUBCUTANEOUS | 3 refills | Status: DC

## 2021-11-18 ENCOUNTER — Ambulatory Visit: Admitting: Nurse Practitioner

## 2021-12-10 NOTE — Progress Notes (Unsigned)
Assessment and Plan: Vicki Shea was seen today for acute visit.  Diagnoses and all orders for this visit:  Mixed hyperlipidemia Continue iet and exercise, if elevated may need to consider adding a statin -     CBC with Differential/Platelet -     COMPLETE METABOLIC PANEL WITH GFR -     Lipid panel -     TSH  Abnormal glucose Start Trulicity Continue diet and exercise.  Perform daily foot/skin check, notify office of any concerning changes.  Check A1C  -     TSH -     Hemoglobin A1c -     Dulaglutide (TRULICITY) 0.37 CW/8.8QB SOPN; Inject 0.75 mg into the skin once a week.  Essential hypertension Currently not on medication. Start HCTZ 25 mg QD. Check BP 3 times a week and keep log, bring to next appointment in 4 weeks - DASH diet, exercise and monitor at home. Call if greater than 130/80.  Go to the ER if any chest pain, shortness of breath, nausea, dizziness, severe HA, changes vision/speech    Morbid obesity with BMI of 45.0-49.9, adult (HCC) Long time is spent counseling on diet and exercise Will focus on limiting saturated fats and simple carbs.  Will begin increasing activity once right knee feels better -     TSH  Malignant neoplasm of overlapping sites of left breast ([place]) Continue medication and follow with Dr. Lindon Shea  Depression Long discussion regarding mood and treatment options Denies thoughts of hurting self or others Strongly encouraged counseling Would like to wait until next visit to start any medication Dicussed use of Lexapro Instructed patient to contact office or on-call physician promptly should condition worsen or any new symptoms appear. IF THE PATIENT HAS ANY SUICIDAL OR HOMICIDAL IDEATIONS, CALL THE OFFICE, DISCUSS WITH A SUPPORT MEMBER, OR GO TO THE ER IMMEDIATELY. Patient was agreeable with this plan.   Hypercalcemia Check CMP and PTH Avoid calcium supplements and TUMS  Right knee Pain Continue to follow with orthopedics  Further  disposition pending results of labs. Discussed med's effects and SE's.   Over 30 minutes of exam, counseling, chart review, and critical decision making was performed.   Future Appointments  Date Time Provider [name]  12/11/2021  3:45 PM [name], NP [facility] None     ------------------------------------------------------------------------------------------------------------------   HPI There were no vitals taken for this visit.  61 y.o.female presents for evaluation of blood sugar, blood pressure and cholesterol  She has a history of left breast cancer treated with lumpectomy/radiation(2021) and on Anastrazole- followed by Dr. Lindon Shea Malignant neoplasm of overlapping sites of left female breast 06/15/2019  Cancer Staging:    Clinical stage from 07/13/2019: Stage IB (cT2, cN0, cM0, G2, ER+, PR+, HER2-)    She was seen at orthopedics, Dr. Eden Lathe for right knee pain today- received a cortisone injection. He does want patient to lose at least 50 pounds.   BMI is There is no height or weight on file to calculate BMI., she has not been working on diet and exercise.  Wt Readings from Last 3 Encounters:  09/10/21 274 lb 12.8 oz (124.6 kg)  05/21/18 232 lb 3.2 oz (105.3 kg)  01/27/18 230 lb (104.3 kg)   She stopped eating well once she was diagnosed with cancer, daughter also died of cervical at that same time at age 72. She continues to have some depressive thoughts. Has switched jobs, last job had a lot of stress. She does also take care of her  mother.  Her usual counseling source, her pastor, just passed away this week  Past Medical History:  Diagnosis Date   Biliary colic    Cholelithiasis    Constipation    Family history of adverse reaction to anesthesia    mother-- hard to wake   GERD (gastroesophageal reflux disease)    History of ectopic pregnancy 1984   s/p  right salpingectomy   History of ovarian cyst 1980s   s/p RSO   Lower extremity edema    OA  (osteoarthritis)    right knee   Thyroid goiter    per pt hx benign bx on the right--- no issues per pt   Wears glasses      Allergies  Allergen Reactions   Cabbage Swelling    Red / purple cabbage- Lip swelling   Voltaren [Diclofenac Sodium] Swelling    Current Outpatient Medications on File Prior to Visit  Medication Sig   anastrozole (ARIMIDEX) 1 MG tablet Take 1 mg by mouth daily.   Biotin 5000 MCG TABS Take by mouth.   Cholecalciferol (VITAMIN D3) 5000 units CAPS Take 1 capsule by mouth every morning.   Dulaglutide (TRULICITY) 9.79 GX/2.1JH SOPN Inject 0.75 mg into the skin once a week.   Ferrous Sulfate (IRON PO) Take by mouth. M, W, F   furosemide (LASIX) 20 MG tablet Take 1 tablet (20 mg total) by mouth daily as needed for edema.   hydrochlorothiazide (HYDRODIURIL) 25 MG tablet Take 1 tablet (25 mg total) by mouth daily.   metFORMIN (GLUCOPHAGE) 500 MG tablet Take 1 tablet (500 mg total) by mouth 2 (two) times daily with a meal.   methylcellulose oral powder Take by mouth daily.   omeprazole (PRILOSEC) 20 MG capsule Take 1 capsule (20 mg total) by mouth 2 (two) times daily before a meal.   sucralfate (CARAFATE) 1 g tablet Take 1 tablet (1 g total) by mouth 4 (four) times daily -  before meals and at bedtime. Dissolve in 2-4 oz of water prior to drinking.   vitamin B-12 (CYANOCOBALAMIN) 500 MCG tablet Take 500 mcg by mouth daily.   vitamin C (ASCORBIC ACID) 500 MG tablet Take 500 mg by mouth every morning.  (Patient not taking: Reported on 09/10/2021)   No current facility-administered medications on file prior to visit.    Review of Systems  Constitutional:  Negative for chills, fever and weight loss.       Weight gain  HENT:  Negative for congestion and hearing loss.   Eyes:  Negative for blurred vision and double vision.  Respiratory:  Negative for cough and shortness of breath.   Cardiovascular:  Negative for chest pain, palpitations, orthopnea and leg swelling.   Gastrointestinal:  Negative for abdominal pain, constipation, diarrhea, heartburn, nausea and vomiting.  Musculoskeletal:  Positive for joint pain (right knee). Negative for falls and myalgias.  Skin:  Negative for rash.  Neurological:  Negative for dizziness, tingling, tremors, loss of consciousness and headaches.  Psychiatric/Behavioral:  Positive for depression. Negative for memory loss and suicidal ideas.    .   Physical Exam:  There were no vitals taken for this visit.  General Appearance: Morbidly obese, pleasant female, in no apparent distress. Eyes: PERRLA, EOMs, conjunctiva no swelling or erythema Sinuses: No Frontal/maxillary tenderness ENT/Mouth: Ext aud canals clear, TMs without erythema, bulging. No erythema, swelling, or exudate on post pharynx.  Tonsils not swollen or erythematous. Hearing normal.  Neck: Supple, thyroid normal.  Respiratory: Respiratory effort normal,  BS equal bilaterally without rales, rhonchi, wheezing or stridor.  Cardio: RRR with no MRGs. Brisk peripheral pulses without edema.  Abdomen: Soft, + BS.  Non tender, no guarding, rebound, hernias, masses. Lymphatics: Non tender without lymphadenopathy.  Musculoskeletal: Full ROM, mildly antalgic gait. Swelling and pain lateral aspect of right knee Skin: Warm, dry without rashes, lesions, ecchymosis.  Neuro: Cranial nerves intact. Normal muscle tone, no cerebellar symptoms. Sensation intact.  Psych: Awake and oriented X 3, normal affect, Insight and Judgment appropriate.     [name], NP 12:16 PM [facility]

## 2021-12-11 ENCOUNTER — Ambulatory Visit (INDEPENDENT_AMBULATORY_CARE_PROVIDER_SITE_OTHER): Admitting: Nurse Practitioner

## 2021-12-11 ENCOUNTER — Encounter: Payer: Self-pay | Admitting: Nurse Practitioner

## 2021-12-11 VITALS — BP 132/80 | HR 99 | Temp 97.7°F | Ht 64.0 in | Wt 252.2 lb

## 2021-12-11 DIAGNOSIS — R7309 Other abnormal glucose: Secondary | ICD-10-CM

## 2021-12-11 DIAGNOSIS — E782 Mixed hyperlipidemia: Secondary | ICD-10-CM | POA: Diagnosis not present

## 2021-12-11 DIAGNOSIS — C50812 Malignant neoplasm of overlapping sites of left female breast: Secondary | ICD-10-CM

## 2021-12-11 DIAGNOSIS — F321 Major depressive disorder, single episode, moderate: Secondary | ICD-10-CM

## 2021-12-11 DIAGNOSIS — I10 Essential (primary) hypertension: Secondary | ICD-10-CM | POA: Diagnosis not present

## 2021-12-11 DIAGNOSIS — Z6841 Body Mass Index (BMI) 40.0 and over, adult: Secondary | ICD-10-CM

## 2021-12-11 DIAGNOSIS — M25561 Pain in right knee: Secondary | ICD-10-CM

## 2021-12-11 MED ORDER — TRULICITY 1.5 MG/0.5ML ~~LOC~~ SOAJ: 1.5000 mg | SUBCUTANEOUS | 1 refills | Status: DC

## 2021-12-11 NOTE — Patient Instructions (Signed)
Can stop HCTZ and check BP daily If BP running greater than 130/80 will need to restart HCTZ  Hypertension, Adult High blood pressure (hypertension) is when the force of blood pumping through the arteries is too strong. The arteries are the blood vessels that carry blood from the heart throughout the body. Hypertension forces the heart to work harder to pump blood and may cause arteries to become narrow or stiff. Untreated or uncontrolled hypertension can lead to a heart attack, heart failure, a stroke, kidney disease, and other problems. A blood pressure reading consists of a higher number over a lower number. Ideally, your blood pressure should be below 120/80. The first ("top") number is called the systolic pressure. It is a measure of the pressure in your arteries as your heart beats. The second ("bottom") number is called the diastolic pressure. It is a measure of the pressure in your arteries as the heart relaxes. What are the causes? The exact cause of this condition is not known. There are some conditions that result in high blood pressure. What increases the risk? Certain factors may make you more likely to develop high blood pressure. Some of these risk factors are under your control, including: Smoking. Not getting enough exercise or physical activity. Being overweight. Having too much fat, sugar, calories, or salt (sodium) in your diet. Drinking too much alcohol. Other risk factors include: Having a personal history of heart disease, diabetes, high cholesterol, or kidney disease. Stress. Having a family history of high blood pressure and high cholesterol. Having obstructive sleep apnea. Age. The risk increases with age. What are the signs or symptoms? High blood pressure may not cause symptoms. Very high blood pressure (hypertensive crisis) may cause: Headache. Fast or irregular heartbeats (palpitations). Shortness of breath. Nosebleed. Nausea and vomiting. Vision  changes. Severe chest pain, dizziness, and seizures. How is this diagnosed? This condition is diagnosed by measuring your blood pressure while you are seated, with your arm resting on a flat surface, your legs uncrossed, and your feet flat on the floor. The cuff of the blood pressure monitor will be placed directly against the skin of your upper arm at the level of your heart. Blood pressure should be measured at least twice using the same arm. Certain conditions can cause a difference in blood pressure between your right and left arms. If you have a high blood pressure reading during one visit or you have normal blood pressure with other risk factors, you may be asked to: Return on a different day to have your blood pressure checked again. Monitor your blood pressure at home for 1 week or longer. If you are diagnosed with hypertension, you may have other blood or imaging tests to help your health care provider understand your overall risk for other conditions. How is this treated? This condition is treated by making healthy lifestyle changes, such as eating healthy foods, exercising more, and reducing your alcohol intake. You may be referred for counseling on a healthy diet and physical activity. Your health care provider may prescribe medicine if lifestyle changes are not enough to get your blood pressure under control and if: Your systolic blood pressure is above 130. Your diastolic blood pressure is above 80. Your personal target blood pressure may vary depending on your medical conditions, your age, and other factors. Follow these instructions at home: Eating and drinking  Eat a diet that is high in fiber and potassium, and low in sodium, added sugar, and fat. An example of this eating  plan is called the DASH diet. DASH stands for Dietary Approaches to Stop Hypertension. To eat this way: Eat plenty of fresh fruits and vegetables. Try to fill one half of your plate at each meal with fruits and  vegetables. Eat whole grains, such as whole-wheat pasta, brown rice, or whole-grain bread. Fill about one fourth of your plate with whole grains. Eat or drink low-fat dairy products, such as skim milk or low-fat yogurt. Avoid fatty cuts of meat, processed or cured meats, and poultry with skin. Fill about one fourth of your plate with lean proteins, such as fish, chicken without skin, beans, eggs, or tofu. Avoid pre-made and processed foods. These tend to be higher in sodium, added sugar, and fat. Reduce your daily sodium intake. Many people with hypertension should eat less than 1,500 mg of sodium a day. Do not drink alcohol if: Your health care provider tells you not to drink. You are pregnant, may be pregnant, or are planning to become pregnant. If you drink alcohol: Limit how much you have to: 0-1 drink a day for women. 0-2 drinks a day for men. Know how much alcohol is in your drink. In the [place] one drink equals one 12 oz bottle of beer (355 mL), one 5 oz glass of wine (148 mL), or one 1 oz glass of hard liquor (44 mL). Lifestyle  Work with your health care provider to maintain a healthy body weight or to lose weight. Ask what an ideal weight is for you. Get at least 30 minutes of exercise that causes your heart to beat faster (aerobic exercise) most days of the week. Activities may include walking, swimming, or biking. Include exercise to strengthen your muscles (resistance exercise), such as Pilates or lifting weights, as part of your weekly exercise routine. Try to do these types of exercises for 30 minutes at least 3 days a week. Do not use any products that contain nicotine or tobacco. These products include cigarettes, chewing tobacco, and vaping devices, such as e-cigarettes. If you need help quitting, ask your health care provider. Monitor your blood pressure at home as told by your health care provider. Keep all follow-up visits. This is important. Medicines Take  over-the-counter and prescription medicines only as told by your health care provider. Follow directions carefully. Blood pressure medicines must be taken as prescribed. Do not skip doses of blood pressure medicine. Doing this puts you at risk for problems and can make the medicine less effective. Ask your health care provider about side effects or reactions to medicines that you should watch for. Contact a health care provider if you: Think you are having a reaction to a medicine you are taking. Have headaches that keep coming back (recurring). Feel dizzy. Have swelling in your ankles. Have trouble with your vision. Get help right away if you: Develop a severe headache or confusion. Have unusual weakness or numbness. Feel faint. Have severe pain in your chest or abdomen. Vomit repeatedly. Have trouble breathing. These symptoms may be an emergency. Get help right away. Call 911. Do not wait to see if the symptoms will go away. Do not drive yourself to the hospital. Summary Hypertension is when the force of blood pumping through your arteries is too strong. If this condition is not controlled, it may put you at risk for serious complications. Your personal target blood pressure may vary depending on your medical conditions, your age, and other factors. For most people, a normal blood pressure is less than 120/80. Hypertension  is treated with lifestyle changes, medicines, or a combination of both. Lifestyle changes include losing weight, eating a healthy, low-sodium diet, exercising more, and limiting alcohol. This information is not intended to replace advice given to you by your health care provider. Make sure you discuss any questions you have with your health care provider. Document Revised: 02/05/2021 Document Reviewed: 02/05/2021 Elsevier Patient Education  [place].

## 2021-12-12 LAB — CBC WITH DIFFERENTIAL/PLATELET
Absolute Monocytes: 525 cells/uL (ref 200–950)
Basophils Absolute: 43 cells/uL (ref 0–200)
Basophils Relative: 0.5 %
Eosinophils Absolute: 172 cells/uL (ref 15–500)
Eosinophils Relative: 2 %
HCT: 35.8 % (ref 35.0–45.0)
Hemoglobin: 11.8 g/dL (ref 11.7–15.5)
Lymphs Abs: 2202 cells/uL (ref 850–3900)
MCH: 27.4 pg (ref 27.0–33.0)
MCHC: 33 g/dL (ref 32.0–36.0)
MCV: 83.3 fL (ref 80.0–100.0)
MPV: 12.1 fL (ref 7.5–12.5)
Monocytes Relative: 6.1 %
Neutro Abs: 5659 cells/uL (ref 1500–7800)
Neutrophils Relative %: 65.8 %
Platelets: 330 10*3/uL (ref 140–400)
RBC: 4.3 10*6/uL (ref 3.80–5.10)
RDW: 13.1 % (ref 11.0–15.0)
Total Lymphocyte: 25.6 %
WBC: 8.6 10*3/uL (ref 3.8–10.8)

## 2021-12-12 LAB — LIPID PANEL
Cholesterol: 190 mg/dL (ref <200)
HDL: 68 mg/dL (ref >=50)
LDL Cholesterol (Calc): 105 mg/dL (calc) — ABNORMAL HIGH
Non-HDL Cholesterol (Calc): 122 mg/dL (calc) (ref <130)
Total CHOL/HDL Ratio: 2.8 (calc) (ref <5.0)
Triglycerides: 78 mg/dL (ref <150)

## 2021-12-12 LAB — COMPLETE METABOLIC PANEL WITH GFR
AG Ratio: 1.5 (calc) (ref 1.0–2.5)
ALT: 22 U/L (ref 6–29)
AST: 12 U/L (ref 10–35)
Albumin: 4.5 g/dL (ref 3.6–5.1)
Alkaline phosphatase (APISO): 83 U/L (ref 37–153)
BUN: 10 mg/dL (ref 7–25)
CO2: 27 mmol/L (ref 20–32)
Calcium: 10.6 mg/dL — ABNORMAL HIGH (ref 8.6–10.4)
Chloride: 102 mmol/L (ref 98–110)
Creat: 0.84 mg/dL (ref 0.50–1.05)
Globulin: 3 g/dL (calc) (ref 1.9–3.7)
Glucose, Bld: 102 mg/dL — ABNORMAL HIGH (ref 65–99)
Potassium: 3.3 mmol/L — ABNORMAL LOW (ref 3.5–5.3)
Sodium: 141 mmol/L (ref 135–146)
Total Bilirubin: 0.3 mg/dL (ref 0.2–1.2)
Total Protein: 7.5 g/dL (ref 6.1–8.1)
eGFR: 80 mL/min/{1.73_m2} (ref >=60)

## 2021-12-12 LAB — PTH, INTACT AND CALCIUM
Calcium: 10.6 mg/dL — ABNORMAL HIGH (ref 8.6–10.4)
PTH: 62 pg/mL (ref 16–77)

## 2022-02-10 ENCOUNTER — Encounter: Payer: Self-pay | Admitting: Internal Medicine

## 2022-03-31 ENCOUNTER — Encounter: Payer: Self-pay | Admitting: Nurse Practitioner

## 2022-04-18 ENCOUNTER — Other Ambulatory Visit: Payer: Self-pay | Admitting: Nurse Practitioner

## 2022-04-18 DIAGNOSIS — R7309 Other abnormal glucose: Secondary | ICD-10-CM

## 2022-04-28 ENCOUNTER — Ambulatory Visit (INDEPENDENT_AMBULATORY_CARE_PROVIDER_SITE_OTHER): Admitting: Nurse Practitioner

## 2022-04-28 ENCOUNTER — Encounter: Payer: Self-pay | Admitting: Nurse Practitioner

## 2022-04-28 VITALS — BP 134/70 | HR 52 | Temp 97.9°F | Ht 64.0 in | Wt 253.8 lb

## 2022-04-28 DIAGNOSIS — I10 Essential (primary) hypertension: Secondary | ICD-10-CM | POA: Diagnosis not present

## 2022-04-28 DIAGNOSIS — M25561 Pain in right knee: Secondary | ICD-10-CM | POA: Diagnosis not present

## 2022-04-28 DIAGNOSIS — Z6841 Body Mass Index (BMI) 40.0 and over, adult: Secondary | ICD-10-CM

## 2022-04-28 MED ORDER — WEGOVY 1 MG/0.5ML ~~LOC~~ SOAJ: 1.0000 mg | SUBCUTANEOUS | 3 refills | Status: DC

## 2022-04-28 NOTE — Progress Notes (Signed)
Assessment and Plan:  Vicki Shea was seen today for follow-up.  Diagnoses and all orders for this visit:  Morbid obesity with BMI of 45.0-49.9, adult ([place]) Fair life protein shakes Eat more frequently - try not to go more than 6 hours without protein Aim for 90 grams of protein a day- 30 breakfast/30 lunch 30 dinner Try to keep net carbs less than 50 Net Carbs=Total Carbs-fiber- sugar alcohols Exercise heartrate 120-140(fat burning zone)- walking 20-30 minutes 4 days a week -     Semaglutide-Weight Management (WEGOVY) 1 MG/0.5ML SOAJ; Inject 1 mg into the skin once a week.  Essential hypertension - continue medications, DASH diet, exercise and monitor at home. Call if greater than 130/80.   Right knee pain, unspecified chronicity Continue to follow with orthopedics      Further disposition pending results of labs. Discussed med's effects and SE's.   Over 30 minutes of exam, counseling, chart review, and critical decision making was performed.   No future appointments.   ------------------------------------------------------------------------------------------------------------------   HPI BP 134/70   Pulse (!) 52   Temp 97.9 F (36.6 C)   Ht '5\' 4"'$  (1.626 m)   Wt 253 lb 12.8 oz (115.1 kg)   SpO2 97%   BMI 43.56 kg/m    62 y.o.female presents for discussion about weight   She needs to lose a certain amount of weight before she can have knee surgery.    BP is currently well controlled with HCTZ 25 mg QD.  BP Readings from Last 3 Encounters:  04/28/22 134/70  12/11/21 132/80  09/10/21 (!) 150/88   BMI is Body mass index is 43.56 kg/m., she has been working on diet . Unable to do a lot os exercise due to knee pain . Has been using Trulicity 1.5 mg weekly with no weight changes Wt Readings from Last 3 Encounters:  04/28/22 253 lb 12.8 oz (115.1 kg)  12/11/21 252 lb 3.2 oz (114.4 kg)  09/10/21 274 lb 12.8 oz (124.6 kg)     Past Medical History:  Diagnosis Date    Biliary colic    Cholelithiasis    Constipation    Family history of adverse reaction to anesthesia    mother-- hard to wake   GERD (gastroesophageal reflux disease)    History of ectopic pregnancy 1984   s/p  right salpingectomy   History of ovarian cyst 1980s   s/p RSO   Lower extremity edema    OA (osteoarthritis)    right knee   Thyroid goiter    per pt hx benign bx on the right--- no issues per pt   Wears glasses      Allergies  Allergen Reactions   Cabbage Swelling    Red / purple cabbage- Lip swelling   Voltaren [Diclofenac Sodium] Swelling    Current Outpatient Medications on File Prior to Visit  Medication Sig   anastrozole (ARIMIDEX) 1 MG tablet Take 1 mg by mouth daily.   Ascorbic Acid (VITAMIN C) 500 MG CAPS Take by mouth.   Biotin 5000 MCG TABS Take by mouth.   Calcium Carbonate Antacid (TUMS PO) Take by mouth.   Cholecalciferol (VITAMIN D3) 5000 units CAPS Take 1 capsule by mouth every morning.   hydrochlorothiazide (HYDRODIURIL) 25 MG tablet Take 1 tablet (25 mg total) by mouth daily.   omeprazole (PRILOSEC) 20 MG capsule Take 1 capsule (20 mg total) by mouth 2 (two) times daily before a meal.   vitamin B-12 (CYANOCOBALAMIN) 500 MCG tablet Take 500  mcg by mouth daily.   metFORMIN (GLUCOPHAGE) 500 MG tablet Take 1 tablet (500 mg total) by mouth 2 (two) times daily with a meal. (Patient not taking: Reported on 04/28/2022)   No current facility-administered medications on file prior to visit.    ROS: all negative except above.   Physical Exam:  BP 134/70   Pulse (!) 52   Temp 97.9 F (36.6 C)   Ht '5\' 4"'$  (1.626 m)   Wt 253 lb 12.8 oz (115.1 kg)   SpO2 97%   BMI 43.56 kg/m   General Appearance: Morbidly obese pleasant female, in no apparent distress. Eyes: PERRLA, EOMs, conjunctiva no swelling or erythema Sinuses: No Frontal/maxillary tenderness ENT/Mouth: Ext aud canals clear, TMs without erythema, bulging. No erythema, swelling, or exudate on  post pharynx.  Tonsils not swollen or erythematous. Hearing normal.  Neck: Supple, thyroid normal.  Respiratory: Respiratory effort normal, BS equal bilaterally without rales, rhonchi, wheezing or stridor.  Cardio: RRR with no MRGs. Brisk peripheral pulses without edema.  Abdomen: Soft, + BS.  Non tender, no guarding, rebound, hernias, masses. Lymphatics: Non tender without lymphadenopathy.  Musculoskeletal: Full ROM, 5/5 strength. Antalgic gait due to pain in right knee Swelling and tenderness of right knee with extension Skin: Warm, dry without rashes, lesions, ecchymosis.  Neuro: Cranial nerves intact. Normal muscle tone, no cerebellar symptoms. Sensation intact.  Psych: Awake and oriented X 3, normal affect, Insight and Judgment appropriate.     [name], NP 10:34 AM [facility]

## 2022-04-28 NOTE — Patient Instructions (Signed)
Semaglutide Injection (Weight Management) What is this medication? SEMAGLUTIDE (SEM a GLOO tide) promotes weight loss. It may also be used to maintain weight loss. It works by decreasing appetite. Changes to diet and exercise are often combined with this medication. This medicine may be used for other purposes; ask your health care provider or pharmacist if you have questions. COMMON BRAND NAME(S): [id number] What should I tell my care team before I take this medication? They need to know if you have any of these conditions: Endocrine tumors (MEN 2) or if someone in your family had these tumors Eye disease, vision problems Gallbladder disease History of depression or mental health disease History of pancreatitis Kidney disease Stomach or intestine problems Suicidal thoughts, plans, or attempt; a previous suicide attempt by you or a family member Thyroid cancer or if someone in your family had thyroid cancer An unusual or allergic reaction to semaglutide, other medications, foods, dyes, or preservatives Pregnant or trying to get pregnant Breast-feeding How should I use this medication? This medication is injected under the skin. You will be taught how to prepare and give it. Take it as directed on the prescription label. It is given once every week (every 7 days). Keep taking it unless your care team tells you to stop. It is important that you put your used needles and pens in a special sharps container. Do not put them in a trash can. If you do not have a sharps container, call your pharmacist or care team to get one. A special MedGuide will be given to you by the pharmacist with each prescription and refill. Be sure to read this information carefully each time. This medication comes with INSTRUCTIONS FOR USE. Ask your pharmacist for directions on how to use this medication. Read the information carefully. Talk to your pharmacist or care team if you have questions. Talk to your care team about  the use of this medication in children. While it may be prescribed for children as young as 12 years for selected conditions, precautions do apply. Overdosage: If you think you have taken too much of this medicine contact a poison control center or emergency room at once. NOTE: This medicine is only for you. Do not share this medicine with others. What if I miss a dose? If you miss a dose and the next scheduled dose is more than 2 days away, take the missed dose as soon as possible. If you miss a dose and the next scheduled dose is less than 2 days away, do not take the missed dose. Take the next dose at your regular time. Do not take double or extra doses. If you miss your dose for 2 weeks or more, take the next dose at your regular time or call your care team to talk about how to restart this medication. What may interact with this medication? Insulin and other medications for diabetes This list may not describe all possible interactions. Give your health care provider a list of all the medicines, herbs, non-prescription drugs, or dietary supplements you use. Also tell them if you smoke, drink alcohol, or use illegal drugs. Some items may interact with your medicine. What should I watch for while using this medication? Visit your care team for regular checks on your progress. It may be some time before you see the benefit from this medication. Drink plenty of fluids while taking this medication. Check with your care team if you have severe diarrhea, nausea, and vomiting, or if you sweat a  lot. The loss of too much body fluid may make it dangerous for you to take this medication. This medication may affect blood sugar levels. Ask your care team if changes in diet or medications are needed if you have diabetes. If you or your family notice any changes in your behavior, such as new or worsening depression, thoughts of harming yourself, anxiety, other unusual or disturbing thoughts, or memory loss, call  your care team right away. Women should inform their care team if they wish to become pregnant or think they might be pregnant. Losing weight while pregnant is not advised and may cause harm to the unborn child. Talk to your care team for more information. What side effects may I notice from receiving this medication? Side effects that you should report to your care team as soon as possible: Allergic reactions--skin rash, itching, hives, swelling of the face, lips, tongue, or throat Change in vision Dehydration--increased thirst, dry mouth, feeling faint or lightheaded, headache, dark yellow or brown urine Gallbladder problems--severe stomach pain, nausea, vomiting, fever Heart palpitations--rapid, pounding, or irregular heartbeat Kidney injury--decrease in the amount of urine, swelling of the ankles, hands, or feet Pancreatitis--severe stomach pain that spreads to your back or gets worse after eating or when touched, fever, nausea, vomiting Thoughts of suicide or self-harm, worsening mood, feelings of depression Thyroid cancer--new mass or lump in the neck, pain or trouble swallowing, trouble breathing, hoarseness Side effects that usually do not require medical attention (report to your care team if they continue or are bothersome): Diarrhea Loss of appetite Nausea Stomach pain Vomiting This list may not describe all possible side effects. Call your doctor for medical advice about side effects. You may report side effects to FDA at 1-800-FDA-1088. Where should I keep my medication? Keep out of the reach of children and pets. Refrigeration (preferred): Store in the refrigerator. Do not freeze. Keep this medication in the original container until you are ready to take it. Get rid of any unused medication after the expiration date. Room temperature: If needed, prior to cap removal, the pen can be stored at room temperature for up to 28 days. Protect from light. If it is stored at room  temperature, get rid of any unused medication after 28 days or after it expires, whichever is first. It is important to get rid of the medication as soon as you no longer need it or it is expired. You can do this in two ways: Take the medication to a medication take-back program. Check with your pharmacy or law enforcement to find a location. If you cannot return the medication, follow the directions in the [place]. NOTE: This sheet is a summary. It may not cover all possible information. If you have questions about this medicine, talk to your doctor, pharmacist, or health care provider.  2023 Elsevier/Gold Standard (2020-06-14 00:00:00)

## 2022-05-14 ENCOUNTER — Encounter: Payer: Self-pay | Admitting: Nurse Practitioner

## 2022-05-22 ENCOUNTER — Other Ambulatory Visit: Payer: Self-pay | Admitting: Nurse Practitioner

## 2022-05-22 MED ORDER — WEGOVY 2.4 MG/0.75ML ~~LOC~~ SOAJ: 2.4000 mg | SUBCUTANEOUS | 3 refills | Status: DC

## 2022-06-03 ENCOUNTER — Telehealth: Payer: Self-pay

## 2022-06-03 NOTE — Telephone Encounter (Signed)
Prior auth for [employer] 2.42m denied. Patient aware.

## 2022-06-04 ENCOUNTER — Other Ambulatory Visit: Payer: Self-pay | Admitting: Nurse Practitioner

## 2022-06-04 DIAGNOSIS — N182 Chronic kidney disease, stage 2 (mild): Secondary | ICD-10-CM

## 2022-06-04 MED ORDER — MOUNJARO 7.5 MG/0.5ML ~~LOC~~ SOAJ: 7.5000 mg | SUBCUTANEOUS | 3 refills | Status: DC

## 2022-06-13 ENCOUNTER — Other Ambulatory Visit: Payer: Self-pay | Admitting: Nurse Practitioner

## 2022-06-13 ENCOUNTER — Other Ambulatory Visit: Payer: Self-pay

## 2022-06-13 DIAGNOSIS — E1122 Type 2 diabetes mellitus with diabetic chronic kidney disease: Secondary | ICD-10-CM

## 2022-06-13 MED ORDER — ZEPBOUND 5 MG/0.5ML ~~LOC~~ SOAJ: 5.0000 mg | SUBCUTANEOUS | 3 refills | Status: DC

## 2022-06-13 NOTE — Telephone Encounter (Signed)
[facility]

## 2022-06-17 ENCOUNTER — Telehealth: Payer: Self-pay | Admitting: Nurse Practitioner

## 2022-06-17 NOTE — Telephone Encounter (Signed)
Pt said that [facility] called her and said they would be sending back to [place] some document. She said if they denied it, it could be bc we didn't include that she has tried other medications like phentermine so that they will cover the medication. She wrote a message last week with more details.

## 2022-06-18 ENCOUNTER — Telehealth: Payer: Self-pay

## 2022-06-18 NOTE — Telephone Encounter (Signed)
Zepbound prior auth completed and submitted.

## 2022-06-19 ENCOUNTER — Other Ambulatory Visit: Payer: Self-pay

## 2022-06-19 NOTE — Telephone Encounter (Signed)
Zepbound denied.

## 2022-06-20 NOTE — Telephone Encounter (Signed)
Qsymia is a combination of Phentermine and Topamax.  She has tried phentermine in the past without success and this has a much lower dose of that ingredient than what she used before. The other ingredient is Topamax which can cause side effects of brain fog and numbness/tingling in hands.   The Contrave is a combination of Wellbutrin and Naltrexone which works mostly for emotional eating, sweets cravings. If she would rather try one of these let me know

## 2022-06-23 ENCOUNTER — Other Ambulatory Visit: Payer: Self-pay | Admitting: Nurse Practitioner

## 2022-06-23 NOTE — Telephone Encounter (Signed)
Insurance will only cover phentermine

## 2022-06-24 ENCOUNTER — Encounter: Payer: Self-pay | Admitting: Nurse Practitioner

## 2022-06-24 ENCOUNTER — Other Ambulatory Visit: Payer: Self-pay | Admitting: Nurse Practitioner

## 2022-06-24 MED ORDER — TOPIRAMATE 25 MG PO TABS: 25.0000 mg | ORAL_TABLET | Freq: Every day | ORAL | 2 refills | Status: DC

## 2022-06-24 MED ORDER — PHENTERMINE HCL 37.5 MG PO TABS: ORAL_TABLET | ORAL | 2 refills | Status: DC

## 2022-06-24 NOTE — Progress Notes (Signed)
Pt wants to try Topamax and Phentermine together for weight loss.  GLP1's are not covered by insurance.  Scripts sent to [facility] on [address]

## 2022-08-28 ENCOUNTER — Telehealth: Payer: Self-pay | Admitting: Nurse Practitioner

## 2022-08-28 NOTE — Telephone Encounter (Signed)
Can you please call pt to get more information.  If she is having adverse effects to meds please stop and schedule appointment with me early next week

## 2022-08-28 NOTE — Telephone Encounter (Signed)
Pt called explaining concern about two different medications (Topamax / Phentermine). She also voiced how her knee is getting worse. Asked about trying different pen.

## 2022-08-28 NOTE — Telephone Encounter (Signed)
Patient would like to change the medication for weight loss, feels that it is not working. Patient is scheduled to be seen in the office for a follow up for this.

## 2022-09-16 NOTE — Progress Notes (Unsigned)
Assessment and Plan:   [facility] was seen today for follow-up.  Diagnoses and all orders for this visit:  Morbid obesity with BMI of 45.0-49.9, adult (HCC) Fair life protein shakes Eat more frequently - try not to go more than 6 hours without protein Aim for 90 grams of protein a day- 30 breakfast/30 lunch 30 dinner Try to keep net carbs less than 50 Net Carbs=Total Carbs-fiber- sugar alcohols Exercise heartrate 120-140(fat burning zone)- walking 20-30 minutes 4 days a week Unable to tolerate Metformin due to GI side effects Continue Phentermine and Topamax but weight loss is slow as unable to exercise due to severe right knee pain- will try to get [facility] approved as she needs to lose 22 more pounds before she can get knee replacement -     TSH- unable to get blood today -     Semaglutide-Weight Management (WEGOVY) 0.5 MG/0.5ML SOAJ; Inject 0.5 mg into the skin once a week.  Essential hypertension - continue medications, DASH diet, exercise and monitor at home. Call if greater than 130/80.   Hypercalcemia Continue to avoid Calcium supplements and TUMS -     COMPLETE METABOLIC PANEL WITH GFR- unable to get blood today  Mixed hyperlipidemia Continue Crestor, diet and exercise as tolerated -     Lipid panel-unable to get blood today  Medication management Continued Unable to get blood today after multiple attempts  Malignant neoplasm of overlapping sites of left breast [facility]) Continue to follow with oncology Continue Anastrazole Getting compression body suit and bra for lymphedema s/p surgery     Further disposition pending results of labs. Discussed med's effects and SE's.   Over 30 minutes of exam, counseling, chart review, and critical decision making was performed.       ------------------------------------------------------------------------------------------------------------------   HPI BP 138/82   Pulse 82   Temp 97.7 F (36.5 C)   Ht 5\' 4"  (1.626 m)   Wt  244 lb 3.2 oz (110.8 kg)   SpO2 97%   BMI 41.92 kg/m    62 y.o.female presents for reevaluation of weight.  Was started on Phentermine 37.5 mg qd and Topiramate 25 mg QD 06/24/22. She is trying to lose weight to get a knee replacement.   She is having a lot of lymphedema s/p breast CA surgery.  She is going to be getting a lymphedema body suit.   Her right knee is becoming more painful, has had injection but only lasted 1 month. She needs to be down to 223 before she can have knee surgery.    BP is currently well controlled with HCTZ 25 mg QD.  BP Readings from Last 3 Encounters:  09/17/22 138/82  04/28/22 134/70  12/11/21 132/80   BMI is Body mass index is 41.92 kg/m., she has been working on diet . Unable to do a lot os exercise due to knee pain . Currently on Phentermine and Topamax but weight loss is slow as she is unable to exercise due to her knee and can't get knee replacement until she gets 21 more pounds. . She has tried metformin and it gave her diarrhea and stomach pain. Wt Readings from Last 3 Encounters:  09/17/22 244 lb 3.2 oz (110.8 kg)  04/28/22 253 lb 12.8 oz (115.1 kg)  12/11/21 252 lb 3.2 oz (114.4 kg)   She is not currently on cholesterol medication and her cholesterol is not at goal  Lab Results  Component Value Date   CHOL 190 12/11/2021   HDL 68 12/11/2021  LDLCALC 105 (H) 12/11/2021   TRIG 78 12/11/2021   CHOLHDL 2.8 12/11/2021    Lab Results  Component Value Date   TSH 1.03 09/10/2021     Past Medical History:  Diagnosis Date   Biliary colic    Cholelithiasis    Constipation    Family history of adverse reaction to anesthesia    mother-- hard to wake   GERD (gastroesophageal reflux disease)    History of ectopic pregnancy 1984   s/p  right salpingectomy   History of ovarian cyst 1980s   s/p RSO   Lower extremity edema    OA (osteoarthritis)    right knee   Thyroid goiter    per pt hx benign bx on the right--- no issues per pt   Wears  glasses      Allergies  Allergen Reactions   Cabbage Swelling    Red / purple cabbage- Lip swelling   Voltaren [Diclofenac Sodium] Swelling    Current Outpatient Medications on File Prior to Visit  Medication Sig   anastrozole (ARIMIDEX) 1 MG tablet Take 1 mg by mouth daily.   Ascorbic Acid (VITAMIN C) 500 MG CAPS Take by mouth.   Cholecalciferol (VITAMIN D3) 5000 units CAPS Take 1 capsule by mouth every morning.   omeprazole (PRILOSEC) 20 MG capsule Take 1 capsule (20 mg total) by mouth 2 (two) times daily before a meal.   phentermine (ADIPEX-P) 37.5 MG tablet Take 1/2 to 1 tablet every morning for dieting & weightloss   topiramate (TOPAMAX) 25 MG tablet Take 1 tablet (25 mg total) by mouth at bedtime.   vitamin B-12 (CYANOCOBALAMIN) 500 MCG tablet Take 500 mcg by mouth daily.   Biotin 5000 MCG TABS Take by mouth. (Patient not taking: Reported on 09/17/2022)   Calcium Carbonate Antacid (TUMS PO) Take by mouth. (Patient not taking: Reported on 09/17/2022)   hydrochlorothiazide (HYDRODIURIL) 25 MG tablet Take 1 tablet (25 mg total) by mouth daily. (Patient not taking: Reported on 09/17/2022)   No current facility-administered medications on file prior to visit.    ROS: all negative except above.   Physical Exam:  BP 138/82   Pulse 82   Temp 97.7 F (36.5 C)   Ht 5\' 4"  (1.626 m)   Wt 244 lb 3.2 oz (110.8 kg)   SpO2 97%   BMI 41.92 kg/m   General Appearance: Morbidly obese pleasant female, in no apparent distress. Eyes: PERRLA, EOMs, conjunctiva no swelling or erythema Sinuses: No Frontal/maxillary tenderness ENT/Mouth: Ext aud canals clear, TMs without erythema, bulging. No erythema, swelling, or exudate on post pharynx.  Tonsils not swollen or erythematous. Hearing normal.  Neck: Supple, thyroid normal.  Respiratory: Respiratory effort normal, BS equal bilaterally without rales, rhonchi, wheezing or stridor.  Cardio: RRR with no MRGs. Brisk peripheral pulses without edema.   Abdomen: Soft, + BS.  Non tender, no guarding, rebound, hernias, masses. Lymphatics: Non tender without lymphadenopathy.  Musculoskeletal: Full ROM, 5/5 strength. Antalgic gait due to pain in right knee Swelling and tenderness of right knee with extension Skin: Warm, dry without rashes, lesions, ecchymosis.  Neuro: Cranial nerves intact. Normal muscle tone, no cerebellar symptoms. Sensation intact.  Psych: Awake and oriented X 3, normal affect, Insight and Judgment appropriate.     Raynelle Dick, NP 9:03 AM [facility]

## 2022-09-17 ENCOUNTER — Encounter: Payer: Self-pay | Admitting: Nurse Practitioner

## 2022-09-17 ENCOUNTER — Telehealth: Payer: Self-pay | Admitting: Nurse Practitioner

## 2022-09-17 ENCOUNTER — Ambulatory Visit (INDEPENDENT_AMBULATORY_CARE_PROVIDER_SITE_OTHER): Admitting: Nurse Practitioner

## 2022-09-17 ENCOUNTER — Other Ambulatory Visit: Payer: Self-pay | Admitting: Nurse Practitioner

## 2022-09-17 VITALS — BP 138/82 | HR 82 | Temp 97.7°F | Ht 64.0 in | Wt 244.2 lb

## 2022-09-17 DIAGNOSIS — Z79899 Other long term (current) drug therapy: Secondary | ICD-10-CM

## 2022-09-17 DIAGNOSIS — I10 Essential (primary) hypertension: Secondary | ICD-10-CM

## 2022-09-17 DIAGNOSIS — Z6841 Body Mass Index (BMI) 40.0 and over, adult: Secondary | ICD-10-CM

## 2022-09-17 DIAGNOSIS — E782 Mixed hyperlipidemia: Secondary | ICD-10-CM | POA: Diagnosis not present

## 2022-09-17 DIAGNOSIS — C50812 Malignant neoplasm of overlapping sites of left female breast: Secondary | ICD-10-CM

## 2022-09-17 MED ORDER — WEGOVY 0.5 MG/0.5ML ~~LOC~~ SOAJ
0.5000 mg | SUBCUTANEOUS | 3 refills | Status: DC
Start: 2022-09-17 — End: 2022-12-19

## 2022-09-17 MED ORDER — WEGOVY 0.5 MG/0.5ML ~~LOC~~ SOAJ
0.5000 mg | SUBCUTANEOUS | 3 refills | Status: DC
Start: 2022-09-17 — End: 2022-09-17

## 2022-09-17 NOTE — Telephone Encounter (Signed)
Patient is requesting that the [facility] to [facility]

## 2022-09-17 NOTE — Patient Instructions (Signed)
Fair life protein shakes Eat more frequently - try not to go more than 6 hours without protein Aim for 90 grams of protein a day- 30 breakfast/30 lunch 30 dinner Try to keep net carbs less than 50 Net Carbs=Total Carbs-fiber- sugar alcohols Exercise heartrate 120-140(fat burning zone)- walking 20-30 minutes 4 days a week  

## 2022-09-22 DIAGNOSIS — Z79899 Other long term (current) drug therapy: Secondary | ICD-10-CM | POA: Diagnosis not present

## 2022-09-22 DIAGNOSIS — E782 Mixed hyperlipidemia: Secondary | ICD-10-CM | POA: Diagnosis not present

## 2022-09-23 LAB — LIPID PANEL
Cholesterol: 198 mg/dL (ref <200)
HDL: 78 mg/dL (ref >=50)
LDL Cholesterol (Calc): 104 mg/dL (calc) — ABNORMAL HIGH
Non-HDL Cholesterol (Calc): 120 mg/dL (calc) (ref <130)
Total CHOL/HDL Ratio: 2.5 (calc) (ref <5.0)
Triglycerides: 72 mg/dL (ref <150)

## 2022-09-23 LAB — COMPLETE METABOLIC PANEL WITH GFR
AG Ratio: 1.4 (calc) (ref 1.0–2.5)
ALT: 14 U/L (ref 6–29)
AST: 12 U/L (ref 10–35)
Albumin: 4.4 g/dL (ref 3.6–5.1)
Alkaline phosphatase (APISO): 97 U/L (ref 37–153)
BUN: 13 mg/dL (ref 7–25)
CO2: 26 mmol/L (ref 20–32)
Calcium: 10.6 mg/dL — ABNORMAL HIGH (ref 8.6–10.4)
Chloride: 103 mmol/L (ref 98–110)
Creat: 0.79 mg/dL (ref 0.50–1.05)
Globulin: 3.2 g/dL (calc) (ref 1.9–3.7)
Glucose, Bld: 87 mg/dL (ref 65–99)
Potassium: 3.9 mmol/L (ref 3.5–5.3)
Sodium: 139 mmol/L (ref 135–146)
Total Bilirubin: 0.2 mg/dL (ref 0.2–1.2)
Total Protein: 7.6 g/dL (ref 6.1–8.1)
eGFR: 85 mL/min/{1.73_m2} (ref >=60)

## 2022-09-23 LAB — TSH: TSH: 1.01 mIU/L (ref 0.40–4.50)

## 2022-09-23 LAB — MAGNESIUM: Magnesium: 1.6 mg/dL (ref 1.5–2.5)

## 2022-10-02 ENCOUNTER — Telehealth: Payer: Self-pay

## 2022-10-02 NOTE — Telephone Encounter (Signed)
[employer] prior auth completed and submitted. 

## 2022-10-14 NOTE — Telephone Encounter (Signed)
Prior auth denied. 

## 2022-10-16 ENCOUNTER — Other Ambulatory Visit: Payer: Self-pay | Admitting: Nurse Practitioner

## 2022-10-16 IMAGING — MG MM DIGITAL DIAGNOSTIC UNILAT*L* W/ TOMO W/ CAD
4 series · 6 of 12 positions shown · non-contrast
Comparison: Previous exam(s).

CLINICAL DATA: History of LEFT lumpectomy with radiation treatment.
Patient had negative sentinel lymph node biopsy. Over the last 2
days, patient developed redness and significant pain/heaviness in
the LEFT breast. Patient has experienced chills without known fever.

EXAM:
DIGITAL DIAGNOSTIC LEFT MAMMOGRAM WITH CAD AND TOMO
ULTRASOUND LEFT BREAST

[L MLO synth-2D]
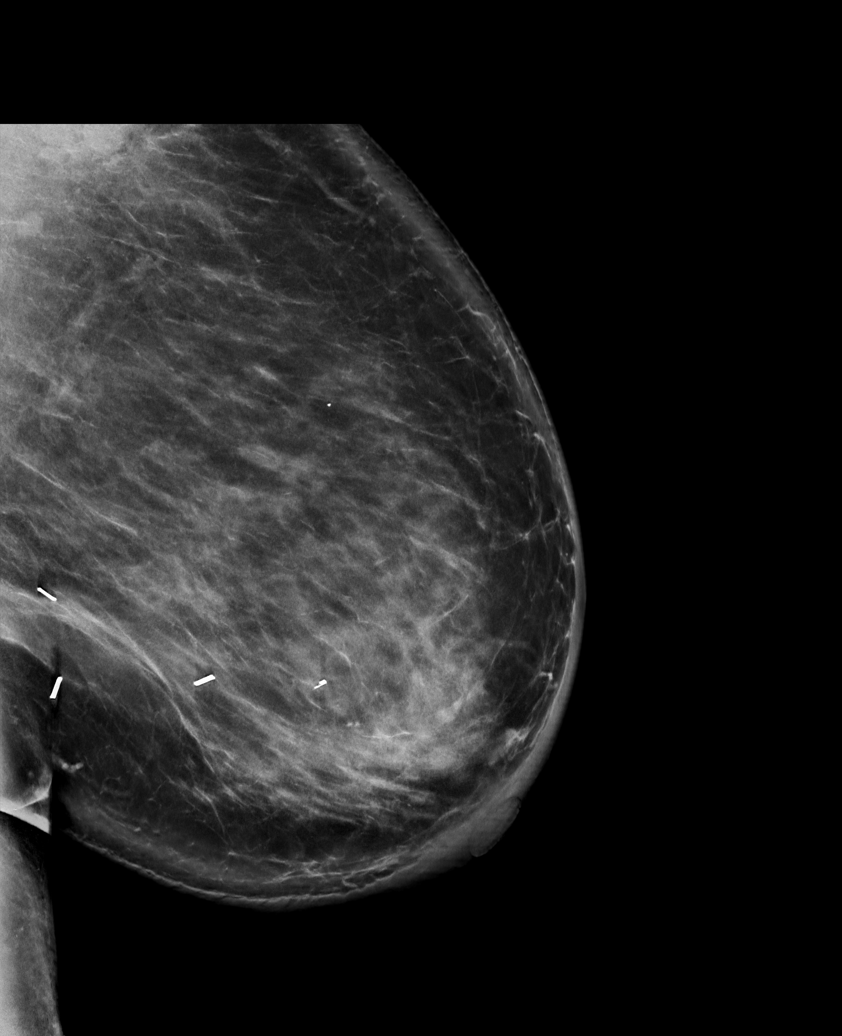

[L CC synth-2D]
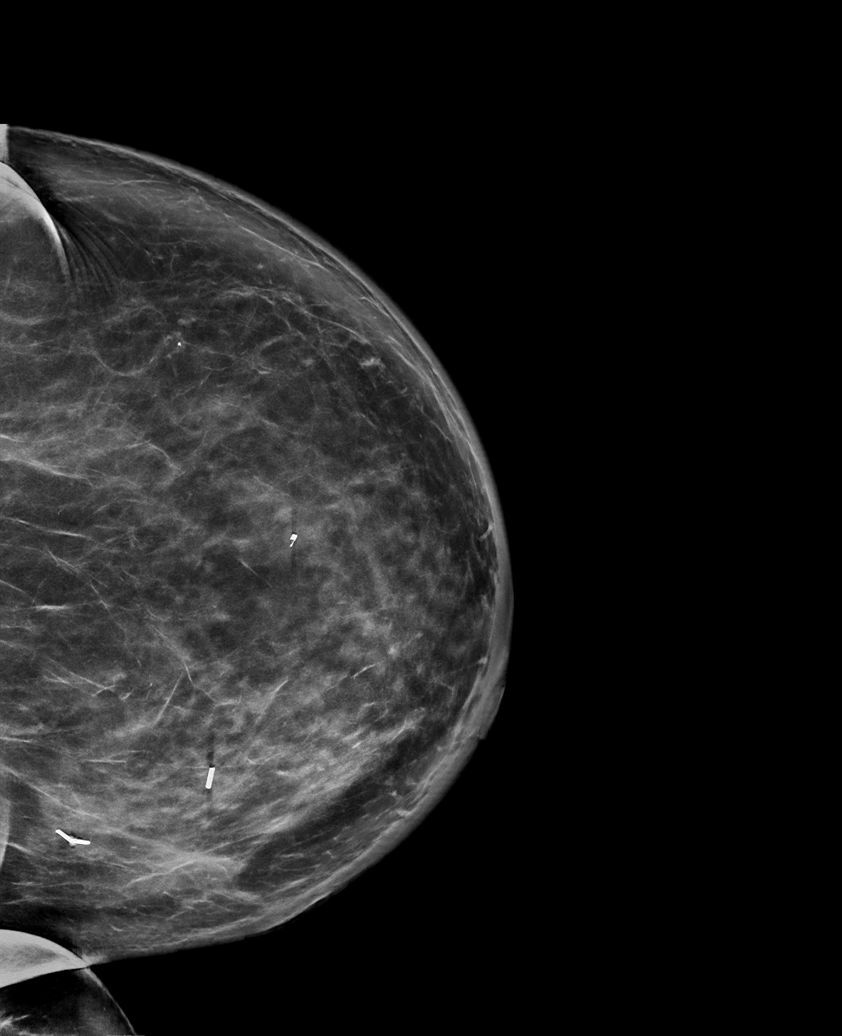

[L CC tomo · 3 of 111 frames shown]
[frame 36/111]
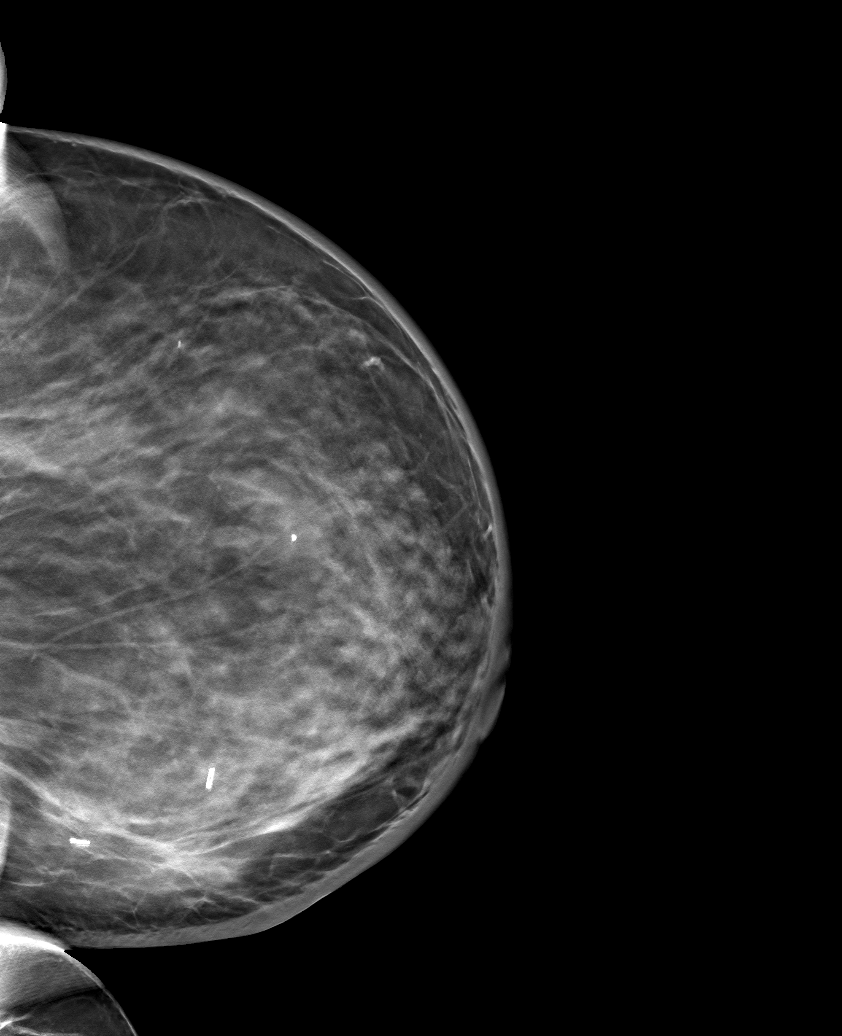
[frame 56/111]
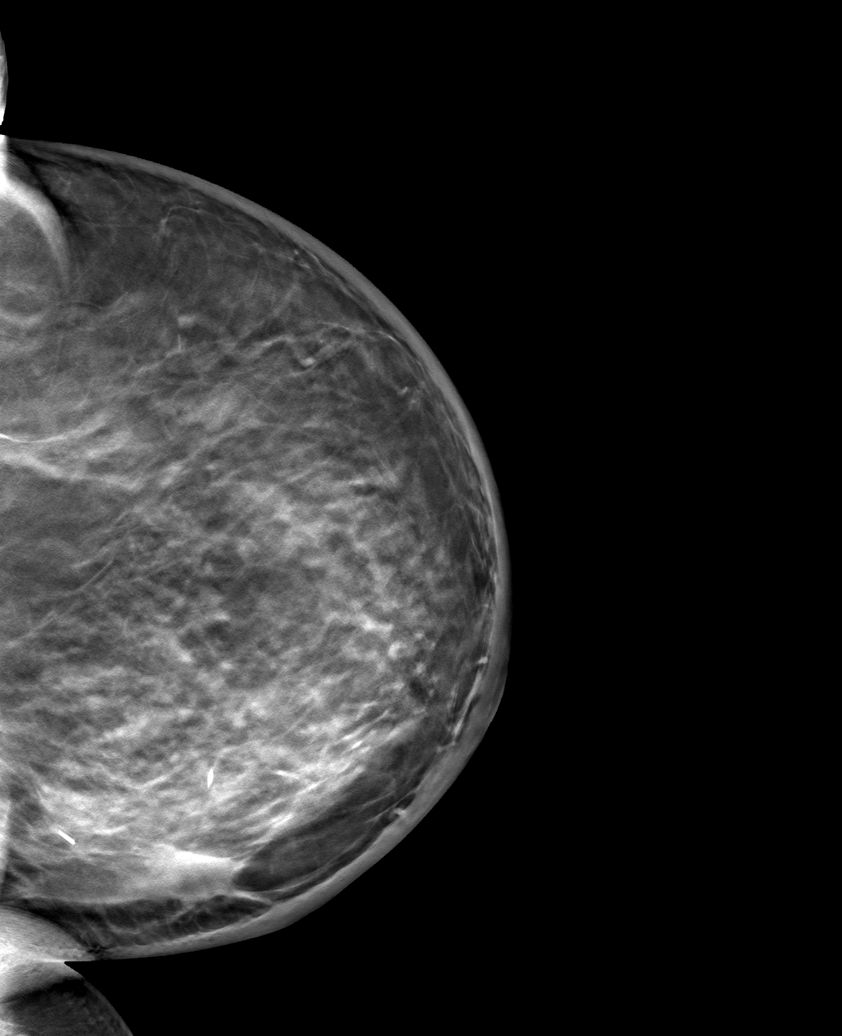
[frame 76/111]
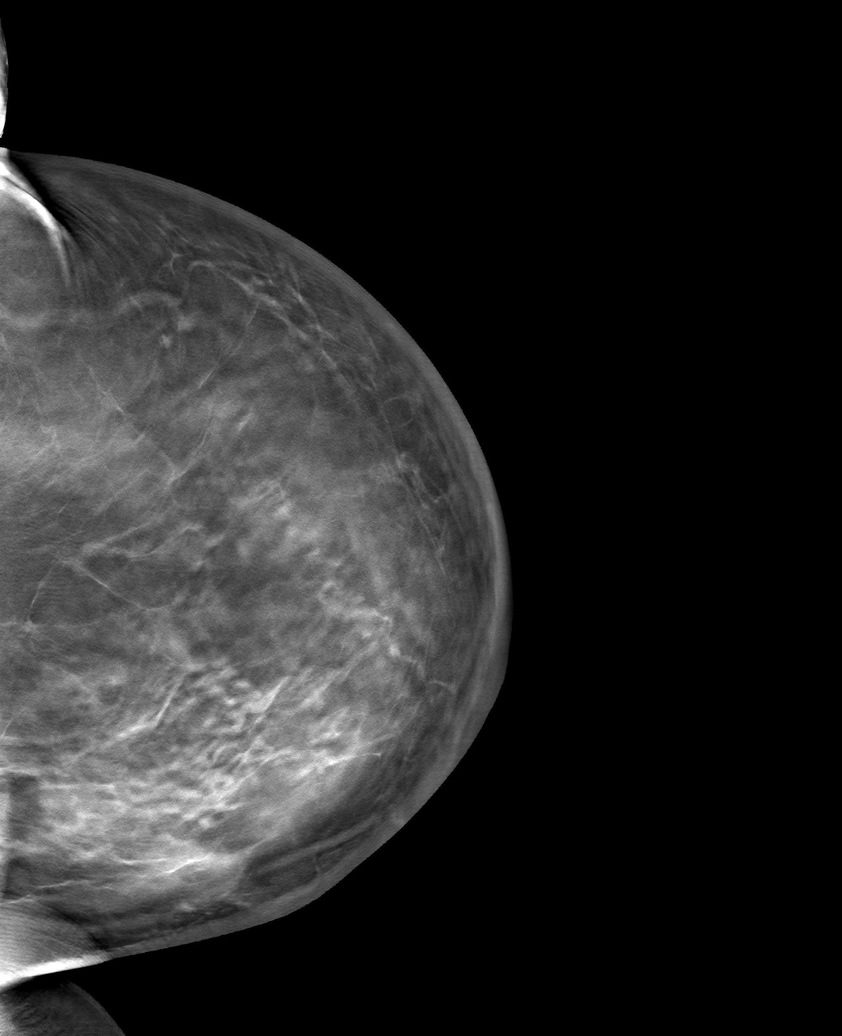

[L MLO tomo · tomo slice 59/116.0]
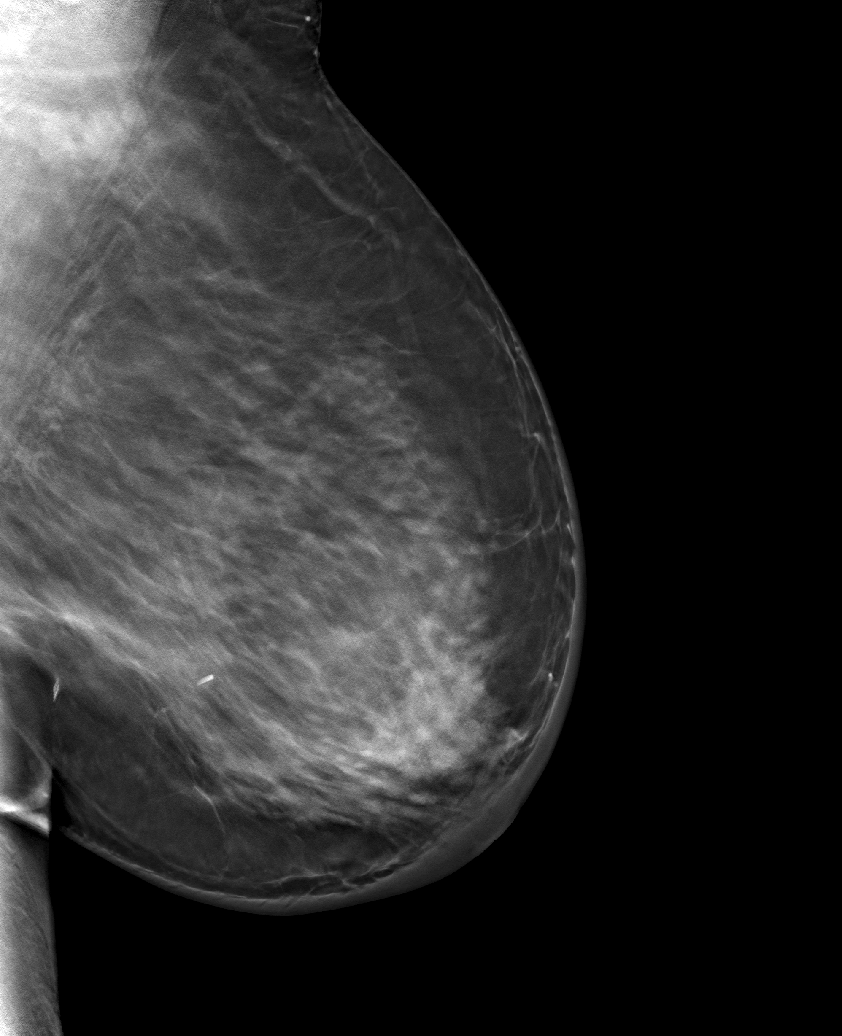

[6 of 12 positions shown; findings below may reference images not displayed]

ACR Breast Density Category c: The breast tissue is heterogeneously
dense, which may obscure small masses.
FINDINGS: There is skin and trabecular thickening throughout the LEFT breast,
particularly involving the LOWER and INNER quadrants. Postoperative
changes are identified in the LOWER INNER QUADRANT of the LEFT
breast. Possibly enlarged lymph node is identified in the LOWER LEFT
axilla.

Mammographic images were processed with CAD.

On physical exam, there is approximately 20 centimeters of erythema
and skin thickening in the central portion of the LEFT breast,
asymmetrically involving the LOWER OUTER QUADRANT of the LEFT
breast. I palpate no mass within this region. There is skin
thickening and fullness along the LOWER quadrants of the LEFT
breast. Well-healed lumpectomy scar is identified in the MEDIAL
aspect of the LEFT breast. There is no skin breakdown or discharge.

Targeted ultrasound is performed, showing thickened skin throughout
the LOWER INNER and LOWER OUTER quadrants of the LEFT breast. No
fluid collection identified to indicate presence of abscess.
Expected lumpectomy cavity identified in the 9 o'clock location.

Evaluation of the LEFT axilla shows a single lymph node with mildly
thickened cortex of 5 millimeters. Other axillary lymph nodes have
normal morphology.
IMPRESSION: Mastitis of the LEFT breast.  There is no evidence for abscess.

Suspect reactive lymph node in the LOWER LEFT axilla, warranting
follow-up.

The patient is scheduled for follow-up with Dr. Ved in 3
days.

RECOMMENDATION:
Recommend antibiotic treatment and clinical follow-up for mastitis.
If mastitis clinically improves, routine follow-up is recommended
for the LEFT breast.

Recommend LEFT axillary ultrasound in 3 months to assess for
resolution of probably reactive lymph node in the LOWER axilla.

I have discussed the findings and recommendations with the patient.
If applicable, a reminder letter will be sent to the patient
regarding the next appointment.

BI-RADS CATEGORY  3: Probably benign.

## 2022-10-20 ENCOUNTER — Other Ambulatory Visit: Payer: Self-pay | Admitting: Nurse Practitioner

## 2022-10-20 ENCOUNTER — Other Ambulatory Visit: Payer: Self-pay

## 2022-10-20 MED ORDER — TOPIRAMATE 25 MG PO TABS
25.0000 mg | ORAL_TABLET | Freq: Every day | ORAL | 2 refills | Status: DC
Start: 2022-10-20 — End: 2022-12-19

## 2022-10-23 ENCOUNTER — Other Ambulatory Visit: Payer: Self-pay | Admitting: Nurse Practitioner

## 2022-10-23 DIAGNOSIS — R7309 Other abnormal glucose: Secondary | ICD-10-CM

## 2022-11-06 ENCOUNTER — Encounter: Payer: Self-pay | Admitting: Nurse Practitioner

## 2022-11-06 MED ORDER — BUPROPION HCL ER (XL) 300 MG PO TB24: 300.0000 mg | ORAL_TABLET | ORAL | 1 refills | Status: DC

## 2022-12-02 ENCOUNTER — Encounter: Payer: Self-pay | Admitting: Nurse Practitioner

## 2022-12-17 NOTE — Progress Notes (Signed)
Assessment and Plan:   Vicki Shea was seen today for follow-up.  Diagnoses and all orders for this visit:  Morbid obesity with BMI of 45.0-49.9, adult (HCC) Fair life protein shakes Eat more frequently - try not to go more than 6 hours without protein Aim for 90 grams of protein a day- 30 breakfast/30 lunch 30 dinner Try to keep net carbs less than 50 Net Carbs=Total Carbs-fiber- sugar alcohols Exercise heartrate 120-140(fat burning zone)- walking 20-30 minutes 4 days a week Unable to tolerate Metformin due to GI side effects Unable to tolerate Phentermine, Topamax or Wellbutrin Start Wegovy 0.25 mg SQ QW #2 ml    Essential hypertension - continue medications, DASH diet, exercise and monitor at home. Call if greater than 130/80.    Medication management Continued   Malignant neoplasm of overlapping sites of left breast [facility]) Continue to follow with oncology Continue Anastrazole Getting compression body suit and bra for lymphedema s/p surgery  Constipation Use miralax, push fluids, continue fresh fruits and vegetable   Further disposition pending results of labs. Discussed med's effects and SE's.   Over 30 minutes of exam, counseling, chart review, and critical decision making was performed.        ------------------------------------------------------------------------------------------------------------------   HPI BP 124/78   Pulse 90   Temp (!) 97.5 F (36.4 C)   Ht 5\' 4"  (1.626 m)   Wt 247 lb 3.2 oz (112.1 kg)   SpO2 98%   BMI 42.43 kg/m    62 y.o.female presents for reevaluation of weight.  Was started on Phentermine 37.5 mg qd and Topiramate 25 mg QD 06/24/22. Weight loss has reached a  plateau. Was started on Wellbutrin as per [employer] recommendation before she is allowed to get approval for [facility]. She is trying to lose weight to get a knee replacement. Wellbutrin is giving her severe constipation.   She is having a lot of lymphedema s/p breast CA  surgery.  She is going to be getting a lymphedema body suit.   Her right knee is becoming more painful, has had injection but only lasted 1 month. She needs to be down to 223 before she can have knee surgery.  Last cortisone shot was 12/01/22 and has already stopped working.   BP is currently well controlled with HCTZ 25 mg PRN.  BP Readings from Last 3 Encounters:  12/19/22 124/78  09/17/22 138/82  04/28/22 134/70  Denies chest pain, shortness of breath, dizziness and headaches.  Exercise is limited due to knee pain.   BMI is Body mass index is 42.43 kg/m., she has been working on diet . Unable to do much exercise due to knee pain . Currently on Phentermine and Topamax but weight loss is slow as she is unable to exercise due to her knee and can't get knee replacement until she gets down to 223. She has tried metformin and it gave her diarrhea and stomach pain. She is currently on Wellbutrin to see if this helps with weight loss.  [employer] states if Topamax, Phentermine and Wellbutrin all fail they will cover Wegovy.  She is eating more fresh fruits and vegetables and lean meats Wt Readings from Last 3 Encounters:  12/19/22 247 lb 3.2 oz (112.1 kg)  09/17/22 244 lb 3.2 oz (110.8 kg)  04/28/22 253 lb 12.8 oz (115.1 kg)     Past Medical History:  Diagnosis Date   Biliary colic    Cholelithiasis    Constipation    Family history of adverse reaction to anesthesia  mother-- hard to wake   GERD (gastroesophageal reflux disease)    History of ectopic pregnancy 1984   s/p  right salpingectomy   History of ovarian cyst 1980s   s/p RSO   Lower extremity edema    OA (osteoarthritis)    right knee   Thyroid goiter    per pt hx benign bx on the right--- no issues per pt   Wears glasses      Allergies  Allergen Reactions   Cabbage Swelling    Red / purple cabbage- Lip swelling   Voltaren [Diclofenac Sodium] Swelling    Current Outpatient Medications on File Prior to Visit   Medication Sig   anastrozole (ARIMIDEX) 1 MG tablet Take 1 mg by mouth daily.   Ascorbic Acid (VITAMIN C) 500 MG CAPS Take by mouth.   buPROPion (WELLBUTRIN XL) 300 MG 24 hr tablet Take 1 tablet (300 mg total) by mouth every morning.   Calcium Carbonate Antacid (TUMS PO) Take by mouth.   Cholecalciferol (VITAMIN D3) 5000 units CAPS Take 1 capsule by mouth every morning.   hydrochlorothiazide (HYDRODIURIL) 25 MG tablet Take 1 tablet (25 mg total) by mouth daily.   omeprazole (PRILOSEC) 20 MG capsule Take 1 capsule (20 mg total) by mouth 2 (two) times daily before a meal.   traZODone (DESYREL) 50 MG tablet Take 1 tablet by mouth at bedtime.   vitamin B-12 (CYANOCOBALAMIN) 500 MCG tablet Take 500 mcg by mouth daily.   Biotin 5000 MCG TABS Take by mouth. (Patient not taking: Reported on 09/17/2022)   phentermine (ADIPEX-P) 37.5 MG tablet TAKE 1/2 TO 1 (ONE-HALF TO ONE) TABLET BY MOUTH ONCE DAILY IN THE MORNING FOR  DIETING  AND  WEIGHTLOSS (Patient not taking: Reported on 12/19/2022)   topiramate (TOPAMAX) 25 MG tablet Take 1 tablet (25 mg total) by mouth at bedtime. (Patient not taking: Reported on 12/19/2022)   No current facility-administered medications on file prior to visit.    ROS: all negative except above.   Physical Exam:  BP 124/78   Pulse 90   Temp (!) 97.5 F (36.4 C)   Ht 5\' 4"  (1.626 m)   Wt 247 lb 3.2 oz (112.1 kg)   SpO2 98%   BMI 42.43 kg/m   General Appearance: Morbidly obese pleasant female, in no apparent distress. Eyes: PERRLA, EOMs, conjunctiva no swelling or erythema Respiratory: Respiratory effort normal, BS equal bilaterally without rales, rhonchi, wheezing or stridor.  Cardio: RRR with no MRGs. Brisk peripheral pulses without edema.  Abdomen: Soft, + BS.  Non tender, no guarding, rebound, hernias, masses. Lymphatics: Non tender without lymphadenopathy.  Musculoskeletal: Full ROM, 5/5 strength. Antalgic gait due to pain in right knee Swelling and tenderness of  right knee with extension Skin: Warm, dry without rashes, lesions, ecchymosis.  Neuro: Cranial nerves intact. Normal muscle tone, no cerebellar symptoms. Sensation intact.  Psych: Awake and oriented X 3, normal affect, Insight and Judgment appropriate.     Raynelle Dick, NP 11:07 AM [facility]

## 2022-12-19 ENCOUNTER — Ambulatory Visit (INDEPENDENT_AMBULATORY_CARE_PROVIDER_SITE_OTHER): Admitting: Nurse Practitioner

## 2022-12-19 ENCOUNTER — Encounter: Payer: Self-pay | Admitting: Nurse Practitioner

## 2022-12-19 VITALS — BP 124/78 | HR 90 | Temp 97.5°F | Ht 64.0 in | Wt 247.2 lb

## 2022-12-19 DIAGNOSIS — Z79899 Other long term (current) drug therapy: Secondary | ICD-10-CM | POA: Diagnosis not present

## 2022-12-19 DIAGNOSIS — Z6841 Body Mass Index (BMI) 40.0 and over, adult: Secondary | ICD-10-CM

## 2022-12-19 DIAGNOSIS — R7309 Other abnormal glucose: Secondary | ICD-10-CM

## 2022-12-19 DIAGNOSIS — E782 Mixed hyperlipidemia: Secondary | ICD-10-CM

## 2022-12-19 DIAGNOSIS — C50812 Malignant neoplasm of overlapping sites of left female breast: Secondary | ICD-10-CM

## 2022-12-19 DIAGNOSIS — K5909 Other constipation: Secondary | ICD-10-CM

## 2022-12-19 DIAGNOSIS — F321 Major depressive disorder, single episode, moderate: Secondary | ICD-10-CM

## 2022-12-19 DIAGNOSIS — I10 Essential (primary) hypertension: Secondary | ICD-10-CM

## 2022-12-19 MED ORDER — WEGOVY 0.25 MG/0.5ML ~~LOC~~ SOAJ
0.2500 mg | SUBCUTANEOUS | 0 refills | Status: DC
Start: 2022-12-19 — End: 2023-01-06

## 2022-12-19 NOTE — Patient Instructions (Signed)
Obesity, Adult Obesity is having too much body fat. Being obese means that your weight is more than what is healthy for you.  BMI (body mass index) is a number that explains how much body fat you have. If you have a BMI of 30 or more, you are obese. Obesity can cause serious health problems, such as: Stroke. Coronary artery disease (CAD). Type 2 diabetes. Some types of cancer. High blood pressure (hypertension). High cholesterol. Gallbladder stones. Obesity can also contribute to: Osteoarthritis. Sleep apnea. Infertility problems. What are the causes? Eating meals each day that are high in calories, sugar, and fat. Drinking a lot of drinks that have sugar in them. Being born with genes that may make you more likely to become obese. Having a medical condition that causes obesity. Taking certain medicines. Sitting a lot (having a sedentary lifestyle). Not getting enough sleep. What increases the risk? Having a family history of obesity. Living in an area with limited access to: Parks, recreation centers, or sidewalks. Healthy food choices, such as grocery stores and farmers' markets. What are the signs or symptoms? The main sign is having too much body fat. How is this treated? Treatment for this condition often includes changing your lifestyle. Treatment may include: Changing your diet. This may include making a healthy meal plan. Exercise. This may include activity that causes your heart to beat faster (aerobic exercise) and strength training. Work with your doctor to design a program that works for you. Medicine to help you lose weight. This may be used if you are not able to lose one pound a week after 6 weeks of healthy eating and more exercise. Treating conditions that cause the obesity. Surgery. Options may include gastric banding and gastric bypass. This may be done if: Other treatments have not helped to improve your condition. You have a BMI of 40 or higher. You have  life-threatening health problems related to obesity. Follow these instructions at home: Eating and drinking  Follow advice from your doctor about what to eat and drink. Your doctor may tell you to: Limit fast food, sweets, and processed snack foods. Choose low-fat options. For example, choose low-fat milk instead of whole milk. Eat five or more servings of fruits or vegetables each day. Eat at home more often. This gives you more control over what you eat. Choose healthy foods when you eat out. Learn to read food labels. This will help you learn how much food is in one serving. Keep low-fat snacks available. Avoid drinks that have a lot of sugar in them. These include soda, fruit juice, iced tea with sugar, and flavored milk. Drink enough water to keep your pee (urine) pale yellow. Do not go on fad diets. Physical activity Exercise often, as told by your doctor. Most adults should get up to 150 minutes of moderate-intensity exercise every week.Ask your doctor: What types of exercise are safe for you. How often you should exercise. Warm up and stretch before being active. Do slow stretching after being active (cool down). Rest between times of being active. Lifestyle Work with your doctor and a food expert (dietitian) to set a weight-loss goal that is best for you. Limit your screen time. Find ways to reward yourself that do not involve food. Do not drink alcohol if: Your doctor tells you not to drink. You are pregnant, may be pregnant, or are planning to become pregnant. If you drink alcohol: Limit how much you have to: 0-1 drink a day for women. 0-2 drinks   a day for men. Know how much alcohol is in your drink. In the [place] one drink equals one 12 oz bottle of beer (355 mL), one 5 oz glass of wine (148 mL), or one 1 oz glass of hard liquor (44 mL). General instructions Keep a weight-loss journal. This can help you keep track of: The food that you eat. How much exercise you  get. Take over-the-counter and prescription medicines only as told by your doctor. Take vitamins and supplements only as told by your doctor. Think about joining a support group. Pay attention to your mental health as obesity can lead to depression or self esteem issues. Keep all follow-up visits. Contact a doctor if: You cannot meet your weight-loss goal after you have changed your diet and lifestyle for 6 weeks. You are having trouble breathing. Summary Obesity is having too much body fat. Being obese means that your weight is more than what is healthy for you. Work with your doctor to set a weight-loss goal. Get regular exercise as told by your doctor. This information is not intended to replace advice given to you by your health care provider. Make sure you discuss any questions you have with your health care provider. Document Revised: 11/06/2020 Document Reviewed: 11/06/2020 Elsevier Patient Education  2024 [employer]  

## 2022-12-29 ENCOUNTER — Encounter: Payer: Self-pay | Admitting: Nurse Practitioner

## 2022-12-30 NOTE — Telephone Encounter (Signed)
Did we do prior auth for this

## 2022-12-31 ENCOUNTER — Telehealth: Payer: Self-pay

## 2022-12-31 NOTE — Telephone Encounter (Signed)
Prior auth approved. Sent patient a message.     Prior auth completed and submitted.

## 2023-01-06 ENCOUNTER — Other Ambulatory Visit: Payer: Self-pay | Admitting: Nurse Practitioner

## 2023-01-06 MED ORDER — WEGOVY 0.25 MG/0.5ML ~~LOC~~ SOAJ
0.2500 mg | SUBCUTANEOUS | 0 refills | Status: DC
Start: 2023-01-06 — End: 2023-02-23

## 2023-01-15 ENCOUNTER — Encounter: Payer: Self-pay | Admitting: Nurse Practitioner

## 2023-01-29 ENCOUNTER — Other Ambulatory Visit: Payer: Self-pay | Admitting: Nurse Practitioner

## 2023-01-29 MED ORDER — PHENTERMINE HCL 37.5 MG PO TABS
ORAL_TABLET | ORAL | 0 refills | Status: AC
Start: 2023-01-29 — End: ?

## 2023-02-11 ENCOUNTER — Encounter: Payer: Self-pay | Admitting: Nurse Practitioner

## 2023-02-23 ENCOUNTER — Other Ambulatory Visit: Payer: Self-pay | Admitting: Nurse Practitioner

## 2023-02-23 MED ORDER — WEGOVY 0.5 MG/0.5ML ~~LOC~~ SOAJ
0.5000 mg | SUBCUTANEOUS | 2 refills | Status: DC
Start: 2023-02-23 — End: 2023-03-30

## 2023-03-30 ENCOUNTER — Other Ambulatory Visit: Payer: Self-pay | Admitting: Nurse Practitioner

## 2023-03-30 ENCOUNTER — Telehealth: Payer: Self-pay | Admitting: Nurse Practitioner

## 2023-03-30 MED ORDER — WEGOVY 1 MG/0.5ML ~~LOC~~ SOAJ
1.0000 mg | SUBCUTANEOUS | 2 refills | Status: DC
Start: 2023-03-30 — End: 2023-04-20

## 2023-03-30 NOTE — Telephone Encounter (Signed)
I sent the prescription to [facility]

## 2023-04-20 ENCOUNTER — Other Ambulatory Visit: Payer: Self-pay | Admitting: Nurse Practitioner

## 2023-04-20 ENCOUNTER — Encounter: Payer: Self-pay | Admitting: Nurse Practitioner

## 2023-04-20 MED ORDER — WEGOVY 1.7 MG/0.75ML ~~LOC~~ SOAJ: 1.7000 mg | SUBCUTANEOUS | 0 refills | Status: DC

## 2023-05-28 ENCOUNTER — Encounter: Payer: Self-pay | Admitting: Internal Medicine

## 2023-06-01 ENCOUNTER — Other Ambulatory Visit: Payer: Self-pay | Admitting: Nurse Practitioner

## 2023-06-02 MED ORDER — WEGOVY 1.7 MG/0.75ML ~~LOC~~ SOAJ: 1.7000 mg | SUBCUTANEOUS | 0 refills | Status: AC
